# Patient Record
Sex: Female | Born: 1935 | Race: White | Hispanic: No | Smoking: Never smoker
Health system: Southern US, Community
[De-identification: ages and names within clinical notes are randomized; demographics above are authoritative.]

## PROBLEM LIST (undated history)

## (undated) DIAGNOSIS — K5520 Angiodysplasia of colon without hemorrhage: Secondary | ICD-10-CM

## (undated) DIAGNOSIS — I471 Supraventricular tachycardia, unspecified: Secondary | ICD-10-CM

## (undated) DIAGNOSIS — K5790 Diverticulosis of intestine, part unspecified, without perforation or abscess without bleeding: Secondary | ICD-10-CM

## (undated) DIAGNOSIS — I10 Essential (primary) hypertension: Secondary | ICD-10-CM

## (undated) DIAGNOSIS — D510 Vitamin B12 deficiency anemia due to intrinsic factor deficiency: Secondary | ICD-10-CM

## (undated) DIAGNOSIS — H905 Unspecified sensorineural hearing loss: Secondary | ICD-10-CM

## (undated) DIAGNOSIS — G450 Vertebro-basilar artery syndrome: Secondary | ICD-10-CM

## (undated) DIAGNOSIS — R413 Other amnesia: Secondary | ICD-10-CM

## (undated) DIAGNOSIS — I951 Orthostatic hypotension: Secondary | ICD-10-CM

## (undated) DIAGNOSIS — R42 Dizziness and giddiness: Secondary | ICD-10-CM

## (undated) DIAGNOSIS — E785 Hyperlipidemia, unspecified: Secondary | ICD-10-CM

## (undated) HISTORY — DX: Hyperlipidemia, unspecified: E78.5

## (undated) HISTORY — DX: Diverticulosis of intestine, part unspecified, without perforation or abscess without bleeding: K57.90

## (undated) HISTORY — DX: Vertebro-basilar artery syndrome: G45.0

## (undated) HISTORY — PX: APPENDECTOMY: SHX54

## (undated) HISTORY — PX: OTHER SURGICAL HISTORY: SHX169

## (undated) HISTORY — PX: BREAST BIOPSY: SHX20

## (undated) HISTORY — DX: Unspecified sensorineural hearing loss: H90.5

## (undated) HISTORY — DX: Essential (primary) hypertension: I10

## (undated) HISTORY — PX: SKIN GRAFT: SHX250

## (undated) HISTORY — DX: Other amnesia: R41.3

## (undated) HISTORY — DX: Supraventricular tachycardia, unspecified: I47.10

## (undated) HISTORY — DX: Angiodysplasia of colon without hemorrhage: K55.20

## (undated) HISTORY — DX: Orthostatic hypotension: I95.1

## (undated) HISTORY — DX: Vitamin B12 deficiency anemia due to intrinsic factor deficiency: D51.0

## (undated) HISTORY — DX: Dizziness and giddiness: R42

## (undated) HISTORY — DX: Supraventricular tachycardia: I47.1

## (undated) HISTORY — PX: THYROID SURGERY: SHX805

---

## 1996-11-27 HISTORY — PX: CATARACT EXTRACTION, BILATERAL: SHX1313

## 1997-08-29 DIAGNOSIS — G450 Vertebro-basilar artery syndrome: Secondary | ICD-10-CM

## 1997-08-29 HISTORY — DX: Vertebro-basilar artery syndrome: G45.0

## 1999-08-30 DIAGNOSIS — H905 Unspecified sensorineural hearing loss: Secondary | ICD-10-CM

## 1999-08-30 HISTORY — DX: Unspecified sensorineural hearing loss: H90.5

## 2007-08-30 DIAGNOSIS — K5790 Diverticulosis of intestine, part unspecified, without perforation or abscess without bleeding: Secondary | ICD-10-CM

## 2007-08-30 HISTORY — DX: Diverticulosis of intestine, part unspecified, without perforation or abscess without bleeding: K57.90

## 2011-07-12 ENCOUNTER — Encounter: Payer: Self-pay | Admitting: Internal Medicine

## 2011-07-27 ENCOUNTER — Encounter: Payer: Self-pay | Admitting: *Deleted

## 2011-07-28 ENCOUNTER — Other Ambulatory Visit: Payer: Self-pay

## 2011-07-28 ENCOUNTER — Ambulatory Visit (INDEPENDENT_AMBULATORY_CARE_PROVIDER_SITE_OTHER): Payer: Medicare Other | Admitting: Internal Medicine

## 2011-07-28 ENCOUNTER — Encounter: Payer: Self-pay | Admitting: Internal Medicine

## 2011-07-28 DIAGNOSIS — I471 Supraventricular tachycardia: Secondary | ICD-10-CM

## 2011-07-28 DIAGNOSIS — R079 Chest pain, unspecified: Secondary | ICD-10-CM | POA: Insufficient documentation

## 2011-07-28 DIAGNOSIS — E785 Hyperlipidemia, unspecified: Secondary | ICD-10-CM | POA: Insufficient documentation

## 2011-07-28 DIAGNOSIS — I498 Other specified cardiac arrhythmias: Secondary | ICD-10-CM

## 2011-07-28 MED ORDER — NITROGLYCERIN 0.4 MG SL SUBL: 0.4000 mg | SUBLINGUAL_TABLET | SUBLINGUAL | Status: DC | PRN

## 2011-07-28 NOTE — Telephone Encounter (Signed)
Patient called,was told Dr. [name] wanted her to have a prescription for NTG 0.4mg  SL prn.Stated she wanted prescription called to [facility] [address] Tia Alert.[facility] [address] [facility] and order given to pharmacist NTG 0.4 mg Sl prn # 30 12 refills.

## 2011-07-28 NOTE — Assessment & Plan Note (Signed)
Continue verapamil for now Consider beta blocker as above pending results of stress testing  She is clear that she would not want EP study.  Given her age, I think that this is quite reasonable. Echo is pending.

## 2011-07-28 NOTE — Assessment & Plan Note (Signed)
She has not previously tolerated statins Will consider a different statin if CAD is identified (As above)

## 2011-07-28 NOTE — Patient Instructions (Signed)
Your physician recommends that you schedule a follow-up appointment in: 4 weeks with Dr [name]   Your physician has requested that you have an echocardiogram. Echocardiography is a painless test that uses sound waves to create images of your heart. It provides your doctor with information about the size and shape of your heart and how well your heart's chambers and valves are working. This procedure takes approximately one hour. There are no restrictions for this procedure.   Your physician has requested that you have a lexiscan myoview. For further information please visit [email or web address]. Please follow instruction sheet, as given.

## 2011-07-28 NOTE — Assessment & Plan Note (Signed)
The patient presents today for cardiology evaluation of recent SVT and also chest discomfort. She has episodes of exertional arm/chest pain which have both typical and atypical features.  She has multiple CAD risk factors including age, HL, and diabetes.  Today, we discussed options of stress testing vs cath.  She is clear that she would prefer a conservative/ noninvasive approach.  We will therefore order Lexiscan Myoview and an echo.  If these are low risk, then we will plan medical therapy going forward.  If high risk features,then we will consider cath.  Given her advanced age, I agree with her that we should try medications rather than cath if possible. I have given slNTG prescription and directions for usage today.  She will call 911 for chest pain not relieved with NTG. Given h/o GI bleeding, she wishes to hold off on ASA until results of stress testing are available.  If normal, I will likely not start ASA.  If not completely normal, we will consider ASA 81mg  daily. If stress test is abnormal, I will switch verapamil to a beta blocker. She has not tolerated statins before due to myalgias.  As our focus is on quality of life, I will not start a statin unless CAD is identified.
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
## 2011-08-03 ENCOUNTER — Ambulatory Visit (HOSPITAL_COMMUNITY): Payer: Medicare Other | Attending: Cardiovascular Disease | Admitting: Radiology

## 2011-08-03 ENCOUNTER — Ambulatory Visit (HOSPITAL_BASED_OUTPATIENT_CLINIC_OR_DEPARTMENT_OTHER): Payer: Medicare Other | Admitting: Radiology

## 2011-08-03 DIAGNOSIS — E785 Hyperlipidemia, unspecified: Secondary | ICD-10-CM | POA: Insufficient documentation

## 2011-08-03 DIAGNOSIS — R002 Palpitations: Secondary | ICD-10-CM | POA: Insufficient documentation

## 2011-08-03 DIAGNOSIS — R5383 Other fatigue: Secondary | ICD-10-CM | POA: Insufficient documentation

## 2011-08-03 DIAGNOSIS — I471 Supraventricular tachycardia: Secondary | ICD-10-CM

## 2011-08-03 DIAGNOSIS — I10 Essential (primary) hypertension: Secondary | ICD-10-CM | POA: Insufficient documentation

## 2011-08-03 DIAGNOSIS — R42 Dizziness and giddiness: Secondary | ICD-10-CM | POA: Insufficient documentation

## 2011-08-03 DIAGNOSIS — R0602 Shortness of breath: Secondary | ICD-10-CM

## 2011-08-03 DIAGNOSIS — E119 Type 2 diabetes mellitus without complications: Secondary | ICD-10-CM | POA: Insufficient documentation

## 2011-08-03 DIAGNOSIS — R5381 Other malaise: Secondary | ICD-10-CM | POA: Insufficient documentation

## 2011-08-03 DIAGNOSIS — Z8249 Family history of ischemic heart disease and other diseases of the circulatory system: Secondary | ICD-10-CM | POA: Insufficient documentation

## 2011-08-03 DIAGNOSIS — I498 Other specified cardiac arrhythmias: Secondary | ICD-10-CM | POA: Insufficient documentation

## 2011-08-03 DIAGNOSIS — R079 Chest pain, unspecified: Secondary | ICD-10-CM | POA: Insufficient documentation

## 2011-08-03 DIAGNOSIS — M79609 Pain in unspecified limb: Secondary | ICD-10-CM | POA: Insufficient documentation

## 2011-08-03 MED ORDER — TECHNETIUM TC 99M TETROFOSMIN IV KIT
11.0000 | PACK | Freq: Once | INTRAVENOUS | Status: AC | PRN
  Administered 2011-08-03: 11 via INTRAVENOUS

## 2011-08-03 MED ORDER — REGADENOSON 0.4 MG/5ML IV SOLN
0.4000 mg | Freq: Once | INTRAVENOUS | Status: AC
  Administered 2011-08-03: 0.4 mg via INTRAVENOUS

## 2011-08-03 MED ORDER — TECHNETIUM TC 99M TETROFOSMIN IV KIT
33.0000 | PACK | Freq: Once | INTRAVENOUS | Status: AC | PRN
  Administered 2011-08-03: 33 via INTRAVENOUS

## 2011-08-03 NOTE — Progress Notes (Signed)
[place] [address] [phone]  Cardiology Nuclear Med Laura Swanson is a 77 y.o. female [id number] 12-May-1925   Nuclear Med Background Indication for Stress Test:  Evaluation for Ischemia History:  10/12 yrs ago ? GXT per patient (No report available), 06/30/11 Event monitor: SVT> verapamil started Cardiac Risk Factors: Family History - CAD, Hypertension, Lipids and NIDDM  Symptoms:  Chest Pressure and Chest Tightness with and without Exertion associated with (L) arm pain (last date of chest discomfort yesterday), Dizziness, DOE, Fatigue, Fatigue with Exertion, Palpitations and SOB   Nuclear Pre-Procedure Caffeine/Decaff Intake:  None NPO After: 7:30pm   Lungs:  Clear IV 0.9% NS with Angio Cath:  20 G  IV Site: R Forearm x 1, tolerated well  IV Started by:  Irven Baltimore, RN  Chest Size (in):  38 Cup Size: B  Height: 5\' 2"  (1.575 m)  Weight:  138 lb (62.596 kg)  BMI:  Body mass index is 25.24 kg/(m^2). Tech Comments:  Took Metformin this AM    Nuclear Med Study 1 or 2 day study: 1 day  Stress Test Type:  Lexiscan  Reading MD: Mertie Moores, MD  Order Authorizing Provider:  Thompson Grayer, MD  Resting Radionuclide: Technetium 35m Tetrofosmin  Resting Radionuclide Dose: 11.0 mCi   Stress Radionuclide:  Technetium 43m Tetrofosmin  Stress Radionuclide Dose: 33.0 mCi           Stress Protocol Rest HR: 82 Stress HR: 106  Rest BP: 126/77 Stress BP: 129/68  Exercise Time (min): n/a METS: n/a   Predicted Max HR: 136 bpm % Max HR: 77.94 bpm Rate Pressure Product: 13674   Dose of Adenosine (mg):  n/a Dose of Lexiscan: 0.4 mg  Dose of Atropine (mg): n/a Dose of Dobutamine: n/a mcg/kg/min (at max HR)  Stress Test Technologist: Irven Baltimore, RN  Nuclear Technologist:  Charlton Amor, CNMT     Rest Procedure:  Myocardial perfusion imaging was performed at rest 45 minutes following the intravenous  administration of Technetium 51m Tetrofosmin. Rest ECG: NSR with poor R wave progression, LAFB, ? old anteroseptal MI, ? ILBBB  Stress Procedure:  The patient received IV Lexiscan 0.4 mg over 15-seconds.  Technetium 84m Tetrofosmin injected at 30-seconds.  The EKG was non diagnostic due to baseline changes. There was a rare PVC.  Quantitative spect images were obtained after a 45 minute delay. Stress ECG: No significant change from baseline ECG  QPS Raw Data Images:  Normal; no motion artifact; normal heart/lung ratio. Stress Images:  Normal homogeneous uptake in all areas of the myocardium. Rest Images:  Normal homogeneous uptake in all areas of the myocardium. Subtraction (SDS):  No evidence of ischemia. Transient Ischemic Dilatation (Normal <1.22):  0.88 Lung/Heart Ratio (Normal <0.45):  0.31  Quantitative Gated Spect Images QGS EDV:  43 ml QGS ESV:  10 ml QGS cine images:  NL LV Function; NL Wall Motion QGS EF: 78%  Impression Exercise Capacity:  Lexiscan with no exercise. BP Response:  Normal blood pressure response. Clinical Symptoms:  No chest pain. ECG Impression:  No significant ST segment change suggestive of ischemia. Comparison with Prior Nuclear Study: No previous nuclear study performed  Overall Impression:  Normal stress nuclear study.  Normal LV function with an EF of 78%.  No evidence of ischemia.   Thayer Headings, [name]., MD, [facility] 08/03/2011, 6:30 PM

## 2011-08-29 ENCOUNTER — Encounter: Payer: Self-pay | Admitting: Internal Medicine

## 2011-08-29 ENCOUNTER — Ambulatory Visit (INDEPENDENT_AMBULATORY_CARE_PROVIDER_SITE_OTHER): Payer: Medicare Other | Admitting: Internal Medicine

## 2011-08-29 DIAGNOSIS — I498 Other specified cardiac arrhythmias: Secondary | ICD-10-CM

## 2011-08-29 DIAGNOSIS — I471 Supraventricular tachycardia: Secondary | ICD-10-CM

## 2011-08-29 DIAGNOSIS — R079 Chest pain, unspecified: Secondary | ICD-10-CM

## 2011-08-29 NOTE — Patient Instructions (Signed)
Your physician wants you to follow-up in: 4 months with Dr Allred You will receive a reminder letter in the mail two months in advance. If you don't receive a letter, please call our office to schedule the follow-up appointment.  

## 2011-08-30 ENCOUNTER — Encounter: Payer: Self-pay | Admitting: Internal Medicine

## 2011-08-30 NOTE — Assessment & Plan Note (Signed)
Continue medical therapy long term No changes at this time
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
## 2011-08-30 NOTE — Assessment & Plan Note (Signed)
Recent myoview is normal No further CV workup is planned.  Interestingly, she attributes her symptoms of dizziness, palpitations, and chest pain to forteo.  Upon review of the literature, it appears that the incidence of orthostatic hypotension is 5%, dizziness 8%, and chest pain  3% with forteo. Though I cannot be sure that forteo has anything to do with her symptoms, I am happy that she is clinically doing better at this time.  She should continue forteo under the direction of Dr Nicki Reaper. As her myoview was normal, no further CV testing is planned at this time.

## 2011-09-29 DIAGNOSIS — D539 Nutritional anemia, unspecified: Secondary | ICD-10-CM | POA: Diagnosis not present

## 2011-10-06 DIAGNOSIS — M810 Age-related osteoporosis without current pathological fracture: Secondary | ICD-10-CM | POA: Diagnosis not present

## 2011-10-10 DIAGNOSIS — E119 Type 2 diabetes mellitus without complications: Secondary | ICD-10-CM | POA: Diagnosis not present

## 2011-10-10 DIAGNOSIS — E782 Mixed hyperlipidemia: Secondary | ICD-10-CM | POA: Diagnosis not present

## 2011-10-10 DIAGNOSIS — Z79899 Other long term (current) drug therapy: Secondary | ICD-10-CM | POA: Diagnosis not present

## 2011-10-13 DIAGNOSIS — E119 Type 2 diabetes mellitus without complications: Secondary | ICD-10-CM | POA: Diagnosis not present

## 2011-10-13 DIAGNOSIS — I471 Supraventricular tachycardia: Secondary | ICD-10-CM | POA: Diagnosis not present

## 2011-10-13 DIAGNOSIS — R079 Chest pain, unspecified: Secondary | ICD-10-CM | POA: Diagnosis not present

## 2011-10-24 DIAGNOSIS — D510 Vitamin B12 deficiency anemia due to intrinsic factor deficiency: Secondary | ICD-10-CM | POA: Diagnosis not present

## 2011-11-22 DIAGNOSIS — D510 Vitamin B12 deficiency anemia due to intrinsic factor deficiency: Secondary | ICD-10-CM | POA: Diagnosis not present

## 2011-12-28 ENCOUNTER — Encounter: Payer: Self-pay | Admitting: Internal Medicine

## 2011-12-28 ENCOUNTER — Ambulatory Visit (INDEPENDENT_AMBULATORY_CARE_PROVIDER_SITE_OTHER): Payer: Medicare Other | Admitting: Internal Medicine

## 2011-12-28 DIAGNOSIS — I471 Supraventricular tachycardia: Secondary | ICD-10-CM

## 2011-12-28 DIAGNOSIS — E785 Hyperlipidemia, unspecified: Secondary | ICD-10-CM | POA: Diagnosis not present

## 2011-12-28 DIAGNOSIS — I498 Other specified cardiac arrhythmias: Secondary | ICD-10-CM | POA: Diagnosis not present

## 2011-12-28 DIAGNOSIS — R079 Chest pain, unspecified: Secondary | ICD-10-CM

## 2011-12-28 DIAGNOSIS — I4891 Unspecified atrial fibrillation: Secondary | ICD-10-CM | POA: Diagnosis not present

## 2011-12-28 DIAGNOSIS — I10 Essential (primary) hypertension: Secondary | ICD-10-CM | POA: Diagnosis not present

## 2011-12-28 NOTE — Patient Instructions (Signed)
Your physician wants you to follow-up in: 4 months with Kathrene Alu and 12 months with Dr Vallery Ridge will receive a reminder letter in the mail two months in advance. If you don't receive a letter, please call our office to schedule the follow-up appointment.

## 2011-12-28 NOTE — Assessment & Plan Note (Signed)
Continue medical therapy long term No changes at this time

## 2011-12-28 NOTE — Assessment & Plan Note (Signed)
She has not previously tolerated statins She recently tried crestor but could not tolerate this.

## 2011-12-28 NOTE — Assessment & Plan Note (Signed)
Resolved She reports musculoskeletal pain in February for which she was evaluated by Dr Nicki Reaper. Presently, she is doing very well.  No further workup planned.  She has not tolerated crestor due to leg cramps.
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
## 2011-12-30 DIAGNOSIS — Z79899 Other long term (current) drug therapy: Secondary | ICD-10-CM | POA: Diagnosis not present

## 2011-12-30 DIAGNOSIS — E559 Vitamin D deficiency, unspecified: Secondary | ICD-10-CM | POA: Diagnosis not present

## 2012-01-04 NOTE — Progress Notes (Signed)
Addended by: Concepcion Living D on: 01/04/2012 10:34 AM   Modules accepted: Orders

## 2012-01-12 DIAGNOSIS — H40019 Open angle with borderline findings, low risk, unspecified eye: Secondary | ICD-10-CM | POA: Diagnosis not present

## 2012-01-26 DIAGNOSIS — D510 Vitamin B12 deficiency anemia due to intrinsic factor deficiency: Secondary | ICD-10-CM | POA: Diagnosis not present

## 2012-02-23 DIAGNOSIS — D510 Vitamin B12 deficiency anemia due to intrinsic factor deficiency: Secondary | ICD-10-CM | POA: Diagnosis not present

## 2012-03-20 DIAGNOSIS — D510 Vitamin B12 deficiency anemia due to intrinsic factor deficiency: Secondary | ICD-10-CM | POA: Diagnosis not present

## 2012-03-28 DIAGNOSIS — E782 Mixed hyperlipidemia: Secondary | ICD-10-CM | POA: Diagnosis not present

## 2012-03-28 DIAGNOSIS — E119 Type 2 diabetes mellitus without complications: Secondary | ICD-10-CM | POA: Diagnosis not present

## 2012-03-28 DIAGNOSIS — D510 Vitamin B12 deficiency anemia due to intrinsic factor deficiency: Secondary | ICD-10-CM | POA: Diagnosis not present

## 2012-04-05 DIAGNOSIS — M810 Age-related osteoporosis without current pathological fracture: Secondary | ICD-10-CM | POA: Diagnosis not present

## 2012-04-05 DIAGNOSIS — I4891 Unspecified atrial fibrillation: Secondary | ICD-10-CM | POA: Diagnosis not present

## 2012-04-05 DIAGNOSIS — E782 Mixed hyperlipidemia: Secondary | ICD-10-CM | POA: Diagnosis not present

## 2012-04-05 DIAGNOSIS — E119 Type 2 diabetes mellitus without complications: Secondary | ICD-10-CM | POA: Diagnosis not present

## 2012-05-03 ENCOUNTER — Encounter: Payer: Self-pay | Admitting: Nurse Practitioner

## 2012-05-03 ENCOUNTER — Ambulatory Visit (INDEPENDENT_AMBULATORY_CARE_PROVIDER_SITE_OTHER): Payer: Medicare Other | Admitting: Nurse Practitioner

## 2012-05-03 VITALS — BP 128/70 | HR 80 | Ht 64.0 in | Wt 142.0 lb

## 2012-05-03 DIAGNOSIS — I498 Other specified cardiac arrhythmias: Secondary | ICD-10-CM | POA: Diagnosis not present

## 2012-05-03 DIAGNOSIS — I471 Supraventricular tachycardia: Secondary | ICD-10-CM

## 2012-05-03 DIAGNOSIS — R079 Chest pain, unspecified: Secondary | ICD-10-CM | POA: Diagnosis not present

## 2012-05-03 MED ORDER — METOPROLOL TARTRATE 25 MG PO TABS: 25.0000 mg | ORAL_TABLET | Freq: Two times a day (BID) | ORAL | Status: DC

## 2012-05-03 NOTE — Patient Instructions (Addendum)
Continue with your current medicines except stop the Losartan.  We are going to add Lopressor 25 mg two times a day. This is to help keep your heart rate from going too fast  I want to see you in 2 months.   Call the [facility] office at [phone] if you have any questions, problems or concerns.
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
## 2012-05-08 DIAGNOSIS — D510 Vitamin B12 deficiency anemia due to intrinsic factor deficiency: Secondary | ICD-10-CM | POA: Diagnosis not present

## 2012-05-17 DIAGNOSIS — Z23 Encounter for immunization: Secondary | ICD-10-CM | POA: Diagnosis not present

## 2012-05-23 DIAGNOSIS — L570 Actinic keratosis: Secondary | ICD-10-CM | POA: Diagnosis not present

## 2012-06-07 DIAGNOSIS — D510 Vitamin B12 deficiency anemia due to intrinsic factor deficiency: Secondary | ICD-10-CM | POA: Diagnosis not present

## 2012-06-25 DIAGNOSIS — H6120 Impacted cerumen, unspecified ear: Secondary | ICD-10-CM | POA: Diagnosis not present

## 2012-06-25 DIAGNOSIS — H9190 Unspecified hearing loss, unspecified ear: Secondary | ICD-10-CM | POA: Diagnosis not present

## 2012-06-26 DIAGNOSIS — Z79899 Other long term (current) drug therapy: Secondary | ICD-10-CM | POA: Diagnosis not present

## 2012-06-26 DIAGNOSIS — E782 Mixed hyperlipidemia: Secondary | ICD-10-CM | POA: Diagnosis not present

## 2012-07-03 ENCOUNTER — Ambulatory Visit: Payer: Medicare Other | Admitting: Nurse Practitioner

## 2012-07-05 DIAGNOSIS — M810 Age-related osteoporosis without current pathological fracture: Secondary | ICD-10-CM | POA: Diagnosis not present

## 2012-07-05 DIAGNOSIS — R002 Palpitations: Secondary | ICD-10-CM | POA: Diagnosis not present

## 2012-07-12 ENCOUNTER — Ambulatory Visit: Payer: Medicare Other | Admitting: Nurse Practitioner

## 2012-07-17 DIAGNOSIS — M810 Age-related osteoporosis without current pathological fracture: Secondary | ICD-10-CM | POA: Diagnosis not present

## 2012-07-19 ENCOUNTER — Ambulatory Visit: Payer: Medicare Other | Admitting: Internal Medicine

## 2012-07-23 ENCOUNTER — Ambulatory Visit (INDEPENDENT_AMBULATORY_CARE_PROVIDER_SITE_OTHER): Payer: Medicare Other | Admitting: Internal Medicine

## 2012-07-23 VITALS — BP 144/78 | HR 68 | Ht 64.0 in | Wt 143.0 lb

## 2012-07-23 DIAGNOSIS — I10 Essential (primary) hypertension: Secondary | ICD-10-CM | POA: Insufficient documentation

## 2012-07-23 DIAGNOSIS — I498 Other specified cardiac arrhythmias: Secondary | ICD-10-CM

## 2012-07-23 DIAGNOSIS — I471 Supraventricular tachycardia: Secondary | ICD-10-CM

## 2012-07-23 DIAGNOSIS — J0100 Acute maxillary sinusitis, unspecified: Secondary | ICD-10-CM | POA: Diagnosis not present
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
## 2012-07-23 NOTE — Assessment & Plan Note (Signed)
BP is mildly elevated 2 gram sodium restriction is advised Given advanced age, I am reluctant to make aggressive changes today She will journal her BP readings at home and present them upon return  Return to see Truitt Merle in 6 months I will see again in 1year

## 2012-07-23 NOTE — Patient Instructions (Signed)
Your physician wants you to follow-up in: 6 months with Laura Swanson and 12 months with Dr Vallery Ridge will receive a reminder letter in the mail two months in advance. If you don't receive a letter, please call our office to schedule the follow-up appointment.

## 2012-07-23 NOTE — Assessment & Plan Note (Signed)
Stable No change required today  

## 2012-08-08 DIAGNOSIS — H40019 Open angle with borderline findings, low risk, unspecified eye: Secondary | ICD-10-CM | POA: Diagnosis not present

## 2012-08-09 DIAGNOSIS — Z1231 Encounter for screening mammogram for malignant neoplasm of breast: Secondary | ICD-10-CM | POA: Diagnosis not present

## 2012-08-09 DIAGNOSIS — Z803 Family history of malignant neoplasm of breast: Secondary | ICD-10-CM | POA: Diagnosis not present

## 2012-08-13 DIAGNOSIS — N3000 Acute cystitis without hematuria: Secondary | ICD-10-CM | POA: Diagnosis not present

## 2012-08-17 DIAGNOSIS — D510 Vitamin B12 deficiency anemia due to intrinsic factor deficiency: Secondary | ICD-10-CM | POA: Diagnosis not present

## 2012-09-13 DIAGNOSIS — D510 Vitamin B12 deficiency anemia due to intrinsic factor deficiency: Secondary | ICD-10-CM | POA: Diagnosis not present

## 2012-09-18 ENCOUNTER — Telehealth: Payer: Self-pay | Admitting: Internal Medicine

## 2012-09-18 ENCOUNTER — Ambulatory Visit (INDEPENDENT_AMBULATORY_CARE_PROVIDER_SITE_OTHER): Payer: Medicare Other | Admitting: Nurse Practitioner

## 2012-09-18 ENCOUNTER — Encounter: Payer: Self-pay | Admitting: Nurse Practitioner

## 2012-09-18 VITALS — BP 130/70 | HR 76 | Ht 63.0 in | Wt 143.8 lb

## 2012-09-18 DIAGNOSIS — I10 Essential (primary) hypertension: Secondary | ICD-10-CM

## 2012-09-18 DIAGNOSIS — R002 Palpitations: Secondary | ICD-10-CM

## 2012-09-18 LAB — CBC WITH DIFFERENTIAL/PLATELET
Basophils Absolute: 0 10*3/uL (ref 0.0–0.1)
Basophils Relative: 0.3 % (ref 0.0–3.0)
Eosinophils Absolute: 0.3 10*3/uL (ref 0.0–0.7)
Eosinophils Relative: 3.2 % (ref 0.0–5.0)
HCT: 37.3 % (ref 36.0–46.0)
Hemoglobin: 12.7 g/dL (ref 12.0–15.0)
Lymphocytes Relative: 34.5 % (ref 12.0–46.0)
Lymphs Abs: 2.7 10*3/uL (ref 0.7–4.0)
MCHC: 34 g/dL (ref 30.0–36.0)
MCV: 90.9 fl (ref 78.0–100.0)
Monocytes Absolute: 0.7 10*3/uL (ref 0.1–1.0)
Monocytes Relative: 8.9 % (ref 3.0–12.0)
Neutro Abs: 4.2 10*3/uL (ref 1.4–7.7)
Neutrophils Relative %: 53.1 % (ref 43.0–77.0)
Platelets: 301 10*3/uL (ref 150.0–400.0)
RBC: 4.1 Mil/uL (ref 3.87–5.11)
RDW: 13.7 % (ref 11.5–14.6)
WBC: 7.9 10*3/uL (ref 4.5–10.5)

## 2012-09-18 LAB — BASIC METABOLIC PANEL
BUN: 22 mg/dL (ref 6–23)
CO2: 28 mEq/L (ref 19–32)
Calcium: 9.2 mg/dL (ref 8.4–10.5)
Chloride: 98 mEq/L (ref 96–112)
Creatinine, Ser: 1 mg/dL (ref 0.4–1.2)
GFR: 57.97 mL/min — ABNORMAL LOW (ref >60.00)
Glucose, Bld: 161 mg/dL — ABNORMAL HIGH (ref 70–99)
Potassium: 4.3 mEq/L (ref 3.5–5.1)
Sodium: 133 mEq/L — ABNORMAL LOW (ref 135–145)

## 2012-09-18 MED ORDER — NITROGLYCERIN 0.4 MG SL SUBL: 0.4000 mg | SUBLINGUAL_TABLET | SUBLINGUAL | Status: DC | PRN
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
## 2012-09-18 NOTE — Telephone Encounter (Signed)
Pt's dtr calling re pt having palpitations since Sunday, none now,  has had left arm pain for a few days, BP last night 180/99 pulse 95 6p, 111/64 pulse 75 8p, this am 123/70 pulse 57 730am, pls advise [phone] dtr karen stone

## 2012-09-18 NOTE — Patient Instructions (Addendum)
Stay on your current medicines  I will see you in 3 months  Try to minimize your use of salt  We are going to check labs today  Call the [place] office at [phone] if you have any questions, problems or concerns.

## 2012-09-18 NOTE — Telephone Encounter (Signed)
Discussed with Cecille Rubin and called daughter back.  Offered appointment tomorrow but they are worried about weather and so they are going to come today at 1:30 and bring her BP cuff with them to calibrate.

## 2012-09-20 ENCOUNTER — Telehealth: Payer: Self-pay | Admitting: Internal Medicine

## 2012-09-20 NOTE — Telephone Encounter (Signed)
Message copied by Earvin Hansen on Thu Sep 20, 2012  2:30 PM ------      Message from: Burtis Junes      Created: Tue Sep 18, 2012  4:20 PM       Ok to report. Labs are satisfactory. Blood sugar is up some.

## 2012-09-20 NOTE — Telephone Encounter (Signed)
Pt calling to get lab results.

## 2012-09-20 NOTE — Telephone Encounter (Signed)
Advised patient of lab results, last night blood sugar was 116

## 2012-10-15 DIAGNOSIS — E119 Type 2 diabetes mellitus without complications: Secondary | ICD-10-CM | POA: Diagnosis not present

## 2012-10-15 DIAGNOSIS — D510 Vitamin B12 deficiency anemia due to intrinsic factor deficiency: Secondary | ICD-10-CM | POA: Diagnosis not present

## 2012-10-15 DIAGNOSIS — Z79899 Other long term (current) drug therapy: Secondary | ICD-10-CM | POA: Diagnosis not present

## 2012-10-15 DIAGNOSIS — I4891 Unspecified atrial fibrillation: Secondary | ICD-10-CM | POA: Diagnosis not present

## 2012-10-15 DIAGNOSIS — I10 Essential (primary) hypertension: Secondary | ICD-10-CM | POA: Diagnosis not present

## 2012-11-29 DIAGNOSIS — D510 Vitamin B12 deficiency anemia due to intrinsic factor deficiency: Secondary | ICD-10-CM | POA: Diagnosis not present

## 2012-12-18 ENCOUNTER — Ambulatory Visit: Payer: Medicare Other | Admitting: Nurse Practitioner

## 2012-12-25 ENCOUNTER — Ambulatory Visit (INDEPENDENT_AMBULATORY_CARE_PROVIDER_SITE_OTHER): Payer: Medicare Other | Admitting: Nurse Practitioner

## 2012-12-25 ENCOUNTER — Encounter: Payer: Self-pay | Admitting: Nurse Practitioner

## 2012-12-25 VITALS — BP 130/78 | HR 60 | Ht 64.0 in | Wt 141.1 lb

## 2012-12-25 DIAGNOSIS — R002 Palpitations: Secondary | ICD-10-CM | POA: Diagnosis not present

## 2012-12-25 DIAGNOSIS — I10 Essential (primary) hypertension: Secondary | ICD-10-CM

## 2012-12-25 NOTE — Patient Instructions (Addendum)
See Dr. [name] in September as planned  Stay on your current medicines  Stay active   Stay safe!  Call the [facility] office at [phone] if you have any questions, problems or concerns.
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
## 2013-01-01 DIAGNOSIS — D510 Vitamin B12 deficiency anemia due to intrinsic factor deficiency: Secondary | ICD-10-CM | POA: Diagnosis not present

## 2013-01-08 DIAGNOSIS — E119 Type 2 diabetes mellitus without complications: Secondary | ICD-10-CM | POA: Diagnosis not present

## 2013-01-08 DIAGNOSIS — E669 Obesity, unspecified: Secondary | ICD-10-CM | POA: Diagnosis not present

## 2013-01-14 DIAGNOSIS — I951 Orthostatic hypotension: Secondary | ICD-10-CM | POA: Diagnosis not present

## 2013-01-14 DIAGNOSIS — Z1211 Encounter for screening for malignant neoplasm of colon: Secondary | ICD-10-CM | POA: Diagnosis not present

## 2013-01-14 DIAGNOSIS — E669 Obesity, unspecified: Secondary | ICD-10-CM | POA: Diagnosis not present

## 2013-01-14 DIAGNOSIS — E119 Type 2 diabetes mellitus without complications: Secondary | ICD-10-CM | POA: Diagnosis not present

## 2013-01-31 DIAGNOSIS — D510 Vitamin B12 deficiency anemia due to intrinsic factor deficiency: Secondary | ICD-10-CM | POA: Diagnosis not present

## 2013-02-08 DIAGNOSIS — J0100 Acute maxillary sinusitis, unspecified: Secondary | ICD-10-CM | POA: Diagnosis not present

## 2013-03-04 DIAGNOSIS — D510 Vitamin B12 deficiency anemia due to intrinsic factor deficiency: Secondary | ICD-10-CM | POA: Diagnosis not present

## 2013-04-05 DIAGNOSIS — D510 Vitamin B12 deficiency anemia due to intrinsic factor deficiency: Secondary | ICD-10-CM | POA: Diagnosis not present

## 2013-04-10 DIAGNOSIS — H40019 Open angle with borderline findings, low risk, unspecified eye: Secondary | ICD-10-CM | POA: Diagnosis not present

## 2013-04-13 DIAGNOSIS — S60229A Contusion of unspecified hand, initial encounter: Secondary | ICD-10-CM | POA: Diagnosis not present

## 2013-04-15 DIAGNOSIS — R279 Unspecified lack of coordination: Secondary | ICD-10-CM | POA: Diagnosis not present

## 2013-04-15 DIAGNOSIS — R42 Dizziness and giddiness: Secondary | ICD-10-CM | POA: Diagnosis not present

## 2013-04-16 DIAGNOSIS — E7800 Pure hypercholesterolemia, unspecified: Secondary | ICD-10-CM | POA: Diagnosis not present

## 2013-04-16 DIAGNOSIS — Z79899 Other long term (current) drug therapy: Secondary | ICD-10-CM | POA: Diagnosis not present

## 2013-04-16 DIAGNOSIS — E119 Type 2 diabetes mellitus without complications: Secondary | ICD-10-CM | POA: Diagnosis not present

## 2013-04-19 DIAGNOSIS — I951 Orthostatic hypotension: Secondary | ICD-10-CM | POA: Diagnosis not present

## 2013-04-19 DIAGNOSIS — E119 Type 2 diabetes mellitus without complications: Secondary | ICD-10-CM | POA: Diagnosis not present

## 2013-04-19 DIAGNOSIS — E669 Obesity, unspecified: Secondary | ICD-10-CM | POA: Diagnosis not present

## 2013-04-22 ENCOUNTER — Other Ambulatory Visit: Payer: Self-pay | Admitting: Nurse Practitioner

## 2013-05-06 ENCOUNTER — Ambulatory Visit (INDEPENDENT_AMBULATORY_CARE_PROVIDER_SITE_OTHER): Payer: Medicare Other | Admitting: Internal Medicine

## 2013-05-06 ENCOUNTER — Encounter: Payer: Self-pay | Admitting: Internal Medicine

## 2013-05-06 VITALS — BP 100/64 | HR 65 | Ht 64.0 in | Wt 142.0 lb

## 2013-05-06 DIAGNOSIS — I951 Orthostatic hypotension: Secondary | ICD-10-CM | POA: Insufficient documentation

## 2013-05-06 DIAGNOSIS — I498 Other specified cardiac arrhythmias: Secondary | ICD-10-CM

## 2013-05-06 DIAGNOSIS — I471 Supraventricular tachycardia: Secondary | ICD-10-CM
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
## 2013-05-06 NOTE — Patient Instructions (Addendum)
Your physician recommends that you schedule a follow-up appointment in: 6 weeks with Laura Swanson and 12 months with Dr [name]  Increase salt intake   Support hose---prescription given

## 2013-05-09 DIAGNOSIS — Z23 Encounter for immunization: Secondary | ICD-10-CM | POA: Diagnosis not present

## 2013-05-13 DIAGNOSIS — D510 Vitamin B12 deficiency anemia due to intrinsic factor deficiency: Secondary | ICD-10-CM | POA: Diagnosis not present

## 2013-06-11 DIAGNOSIS — R079 Chest pain, unspecified: Secondary | ICD-10-CM | POA: Diagnosis not present

## 2013-06-11 DIAGNOSIS — D510 Vitamin B12 deficiency anemia due to intrinsic factor deficiency: Secondary | ICD-10-CM | POA: Diagnosis not present

## 2013-06-11 DIAGNOSIS — I951 Orthostatic hypotension: Secondary | ICD-10-CM | POA: Diagnosis not present

## 2013-06-12 ENCOUNTER — Encounter: Payer: Self-pay | Admitting: Physician Assistant

## 2013-06-12 ENCOUNTER — Ambulatory Visit (INDEPENDENT_AMBULATORY_CARE_PROVIDER_SITE_OTHER): Payer: Medicare Other | Admitting: Physician Assistant

## 2013-06-12 VITALS — BP 100/68 | HR 60 | Ht 64.0 in | Wt 139.0 lb

## 2013-06-12 DIAGNOSIS — I498 Other specified cardiac arrhythmias: Secondary | ICD-10-CM | POA: Diagnosis not present

## 2013-06-12 DIAGNOSIS — E785 Hyperlipidemia, unspecified: Secondary | ICD-10-CM

## 2013-06-12 DIAGNOSIS — I471 Supraventricular tachycardia: Secondary | ICD-10-CM

## 2013-06-12 DIAGNOSIS — R079 Chest pain, unspecified: Secondary | ICD-10-CM

## 2013-06-12 DIAGNOSIS — I951 Orthostatic hypotension: Secondary | ICD-10-CM

## 2013-06-12 DIAGNOSIS — I10 Essential (primary) hypertension: Secondary | ICD-10-CM | POA: Diagnosis not present

## 2013-06-12 MED ORDER — FAMOTIDINE 20 MG PO TABS: 20.0000 mg | ORAL_TABLET | Freq: Two times a day (BID) | ORAL | Status: DC

## 2013-06-12 NOTE — Progress Notes (Signed)
[place], [address], [place]  [address] Phone: [phone] Fax:  [phone]  Date:  06/12/2013   ID:  Silas Michiels, DOB 02-18-1925, MRN [id number]  PCP:  Ann Held, MD  Cardiologist:  Dr. Thompson Grayer     History of Present Illness: Laura Swanson is a 79 y.o. female who returns for the evaluation of CP.  She has a history of HTN, DM 2, HL, SVT which is managed medically and chronic palpitations, prior GI bleed, diverticular disease, vertebrobasilar insufficiency, dementia. Lexiscan Myoview (12/12): EF 78%, normal study. Echocardiogram (12/12): Mild LVH, vigorous LV function, EF 65-70%, moderate TR, PASP 38, trivial effusion. Last seen by Dr. [name] 04/2013.  At that time, she has some postural dizziness. She was asked to liberalize salt and use support stockings. She was to follow up with Truitt Merle, NP next week.    She has been using support hose since last seen.  She thinks her dizziness may be somewhat better.  However, she still notes lightheadedness with standing and difficulty with her balance.  She has not fallen anymore.  Her daughter notes her BP drops 40 points from sitting to standing at times.  She had a couple episodes of CP over the past weekend.  These occurred at rest while lying down to go to sleep.  It did not wake her up.  She notes radiation to her L shoulder.  She took NTG with relief.  She remains fairly active at her ALF.  She denies exertional CP.  She denies significant dyspnea.  She is NYHA Class II-IIb.  She likes to sleep in her recliner.  No PND.  No syncope.  She does note pain in her chest with palpation.  She denies pleuritic CP.  She denies CP assoc with certain meals.  She notes some questionable symptoms of dyspepsia.  No vomiting, diarrhea, melena, hematochezia.   Labs (1/14):   K 4.3, creatinine 1.0, Hgb 12.7  Wt Readings from Last 3 Encounters:  05/06/13 142 lb (64.411 kg)  12/25/12 141 lb 1.9 oz (64.012 kg)  09/18/12 143 lb 12.8 oz  (65.227 kg)     Past Medical History  Diagnosis Date  . Paroxysmal supraventricular tachycardia   . Occult GI bleeding 2009    capsule endoscopy  . Diverticular disease 2009    hx  . Osteoporosis   . Gastric ulcer 1990's    with MALT tumor of  stomach  . Hearing disorder, sensorineural 2001    initially had marked vertigo withacute layrnthitis, which lead to hearing loss  . Diabetes mellitus 1999  . Vertebrobasilar insufficiency 1999    on the basis of abnormal transcranial doppler studies and suspicious symptoms  . Pernicious anemia   . Angiodysplasia of colon   . Memory change   . Vertigo     controlled with meclizine  . Hypertension   . Hyperlipidemia     quit statin due to leg cramps    Current Outpatient Prescriptions  Medication Sig Dispense Refill  . calcium carbonate (OS-CAL) 600 MG TABS Take 600 mg by mouth daily.        . Cholecalciferol (VITAMIN D) 2000 UNITS CAPS Take 2,000 Units by mouth as directed. Take 2 daily      . Coenzyme Q10 (CO Q 10) 100 MG CAPS Take by mouth. Taking 200mg  daily      . meclizine (ANTIVERT) 12.5 MG tablet Take 25 mg by mouth 3 (three) times daily as needed.      Marland Kitchen  metFORMIN (GLUCOPHAGE) 500 MG tablet Take 500 mg by mouth 2 (two) times daily with a meal.      . metoprolol tartrate (LOPRESSOR) 25 MG tablet       . nitroGLYCERIN (NITROSTAT) 0.4 MG SL tablet Place 1 tablet (0.4 mg total) under the tongue every 5 (five) minutes as needed for chest pain.  25 tablet  0  . ONGLYZA 5 MG TABS tablet Take 1 tablet by mouth daily.      . rosuvastatin (CRESTOR) 10 MG tablet Take 10 mg by mouth as directed. 1 tablet on Friday only       No current facility-administered medications for this visit.    Allergies:    Allergies  Allergen Reactions  . Antiseptic Products, Misc.   Marland Kitchen Disinfectant Products Misc   . Iodine   . Lipitor [Atorvastatin]     cramps  . Statins   . Verapamil     constipation    Social History:  The patient  reports that  she has never smoked. She has never used smokeless tobacco. She reports that she does not drink alcohol or use illicit drugs.   Family History:  The patient's family history includes Breast cancer (age of onset: 18) in her mother; Heart failure in her father; Lung disease in her father.   ROS:  Please see the history of present illness.      All other systems reviewed and negative.   PHYSICAL EXAM: VS:  BP 110/50  Pulse 60  Ht 5\' 4"  (1.626 m)  Wt 139 lb (63.05 kg)  BMI 23.85 kg/m2  Filed Vitals:   06/12/13 0928 06/12/13 0939 06/12/13 1013 06/12/13 1016  BP: 108/70 100/70 102/68 110/50  Pulse: 61 58 60 60  Height: 5\' 4"  (1.626 m)     Weight: 139 lb (63.05 kg)        Well nourished, well developed, in no acute distress HEENT: normal Neck: no JVD Cardiac:  normal S1, S2; RRR; no murmur Lungs:  clear to auscultation bilaterally, no wheezing, rhonchi or rales Abd: soft, nontender, no hepatomegaly Ext: no edema Skin: warm and dry Neuro:  CNs 2-12 intact, no focal abnormalities noted  EKG:  NSR, HR 61, LAD, LVH, repol abnormality, no change from prior tracing     ASSESSMENT AND PLAN:  1. Chest Pain:  Symptoms are largely atypical with some typical features.  Myoview in 2012 was normal.  ECG is unchanged. She had an ECG yesterday at her PCP office that was reportedly changed.  I do not have this ECG but will request it.  She notes pain with lying flat and with palpation.  At this point, I do not believe that she needs further ischemic testing. I will have the patient start Pepcid 20 BID to see if this helps her symptoms.  I reviewed this with Dr. Thompson Grayer today who agreed. 2. SVT:  No apparent recurrence.  3. Orthostasis:  No orthostatic BP drop in the office today.  Continue with current Rx.  4. Hypertension:  Controlled.  5. Hyperlipidemia:  Continue statin. 6. Disposition:  F/u with Dr. Thompson Grayer in 6 mos.   Signed, Richardson Dopp, PA-C  06/12/2013 8:31 AM    Addendum:   I received the ECGs from her PCPs office.  These have been reviewed.  There are no significant changes when compared to prior tracings. Signed, Richardson Dopp, PA-C   06/12/2013 9:50 PM

## 2013-06-12 NOTE — Patient Instructions (Signed)
START PEPCID 20 MG TWICE DAILY  MAKE SURE TO CONTINUE TO WEAR SUPPORT HOSE  MAKE SURE TO GET UP SLOWLY FROM A LAYING DOWN OR SITTING POSITION  YOU CAN ADD EXTRA SALT TO YOUR DIET  Your physician wants you to follow-up in: [place] DR. ALLRED. You will receive a reminder letter in the mail two months in advance. If you don't receive a letter, please call our office to schedule the follow-up appointment.

## 2013-06-17 ENCOUNTER — Ambulatory Visit: Payer: Medicare Other | Admitting: Nurse Practitioner

## 2013-06-21 DIAGNOSIS — J0100 Acute maxillary sinusitis, unspecified: Secondary | ICD-10-CM | POA: Diagnosis not present

## 2013-07-09 DIAGNOSIS — D510 Vitamin B12 deficiency anemia due to intrinsic factor deficiency: Secondary | ICD-10-CM | POA: Diagnosis not present

## 2013-07-15 ENCOUNTER — Other Ambulatory Visit: Payer: Self-pay | Admitting: Nurse Practitioner

## 2013-08-15 DIAGNOSIS — D510 Vitamin B12 deficiency anemia due to intrinsic factor deficiency: Secondary | ICD-10-CM | POA: Diagnosis not present

## 2013-09-08 DIAGNOSIS — R059 Cough, unspecified: Secondary | ICD-10-CM | POA: Diagnosis not present

## 2013-09-08 DIAGNOSIS — J0100 Acute maxillary sinusitis, unspecified: Secondary | ICD-10-CM | POA: Diagnosis not present

## 2013-09-17 DIAGNOSIS — D510 Vitamin B12 deficiency anemia due to intrinsic factor deficiency: Secondary | ICD-10-CM | POA: Diagnosis not present

## 2013-09-24 DIAGNOSIS — IMO0001 Reserved for inherently not codable concepts without codable children: Secondary | ICD-10-CM | POA: Diagnosis not present

## 2013-09-24 DIAGNOSIS — E782 Mixed hyperlipidemia: Secondary | ICD-10-CM | POA: Diagnosis not present

## 2013-09-24 DIAGNOSIS — D539 Nutritional anemia, unspecified: Secondary | ICD-10-CM | POA: Diagnosis not present

## 2013-09-30 DIAGNOSIS — IMO0001 Reserved for inherently not codable concepts without codable children: Secondary | ICD-10-CM | POA: Diagnosis not present

## 2013-10-22 ENCOUNTER — Ambulatory Visit: Payer: Self-pay | Admitting: Podiatrist

## 2013-10-28 DIAGNOSIS — D510 Vitamin B12 deficiency anemia due to intrinsic factor deficiency: Secondary | ICD-10-CM | POA: Diagnosis not present

## 2013-11-05 ENCOUNTER — Encounter: Payer: Self-pay | Admitting: Podiatrist

## 2013-11-05 ENCOUNTER — Ambulatory Visit (INDEPENDENT_AMBULATORY_CARE_PROVIDER_SITE_OTHER): Payer: Medicare Other | Admitting: Podiatrist

## 2013-11-05 VITALS — BP 116/70 | HR 67 | Resp 18

## 2013-11-05 DIAGNOSIS — B351 Tinea unguium: Secondary | ICD-10-CM | POA: Diagnosis not present

## 2013-11-05 DIAGNOSIS — M79609 Pain in unspecified limb: Secondary | ICD-10-CM | POA: Diagnosis not present

## 2013-11-05 DIAGNOSIS — E119 Type 2 diabetes mellitus without complications: Secondary | ICD-10-CM | POA: Diagnosis not present

## 2013-11-05 NOTE — Progress Notes (Deleted)
Nails 1-5 left, 1 right.  Rest are just long.  Dorsal exostosis top of left foot.  Circulation 2/4 sensation intact

## 2013-11-05 NOTE — Patient Instructions (Signed)

## 2013-11-05 NOTE — Progress Notes (Signed)
   Subjective:    Patient ID: Laura Swanson, female    DOB: 02-28-1925, 80 y.o.   MRN: [id number]  HPI I AM HERE TO GET MY TOENAILS TRIMMED     Review of Systems  HENT: Positive for hearing loss and sinus pressure.   Eyes: Positive for visual disturbance.  Cardiovascular: Positive for leg swelling.  Gastrointestinal: Positive for constipation.  Endocrine:       EXCESSIVE THIRST  Musculoskeletal: Positive for gait problem.  Neurological: Positive for weakness.  All other systems reviewed and are negative.       Objective:   Physical Exam GENERAL APPEARANCE: Alert, conversant. Appropriately groomed. No acute distress.  VASCULAR: Pedal pulses palpable at 1/4 DP and PT bilateral.  Capillary refill time is immediate to all digits,  Proximal to distal cooling it warm to warm.   NEUROLOGIC: sensation is intact epicritically and protectively to 5.07 monofilament at 5/5 sites bilateral.  Light touch is intact bilateral, vibratory sensation decreased bilateral, achilles tendon reflex is intact bilateral.  MUSCULOSKELETAL: acceptable muscle strength, tone and stability bilateral. DERMATOLOGIC: skin color, texture, and turger are within normal limits.  No preulcerative lesions are seen, no interdigital maceration noted.  No open lesions present.  Digital nails are elongated, friable , fragile and uncomfortable in shoes.      Assessment & Plan:  Diabetes, symptommatic toenails  Discussed etiology, pathology, conservative vs. Surgical therapies and at this time debridement of symptomatic toenails was recommended.  Onychoreduction of symptomatic toenails was performed without iatrogenic incident.  Patient was instructed on signs and symptoms of infection and was told to call immediately should any of these arise.

## 2013-11-28 DIAGNOSIS — D510 Vitamin B12 deficiency anemia due to intrinsic factor deficiency: Secondary | ICD-10-CM | POA: Diagnosis not present

## 2013-12-09 DIAGNOSIS — H40019 Open angle with borderline findings, low risk, unspecified eye: Secondary | ICD-10-CM | POA: Diagnosis not present

## 2013-12-12 ENCOUNTER — Telehealth: Payer: Self-pay | Admitting: Internal Medicine

## 2013-12-12 NOTE — Telephone Encounter (Signed)
Says someone told her Mom to decrease her Metoprolol to once daily.  She says this is all coming from her Mom.  I do not see anywhere that it has been decreased.  She feels fine and is scheduled to follow up in June with Dr [name].  She will continue as she is taking until follow up

## 2013-12-12 NOTE — Telephone Encounter (Signed)
New Message:   Pt's daughter, Mrs. Joaquim Lai, is requesting a call back with clarification on her mom's meds. She wants to know what exactly she is to take and when.. States she believes her mom may be taking her meds incorrectly. The pt is scheduled to see Dr. [name] in June.

## 2013-12-24 DIAGNOSIS — IMO0001 Reserved for inherently not codable concepts without codable children: Secondary | ICD-10-CM | POA: Diagnosis not present

## 2013-12-24 DIAGNOSIS — E782 Mixed hyperlipidemia: Secondary | ICD-10-CM | POA: Diagnosis not present

## 2013-12-30 DIAGNOSIS — IMO0001 Reserved for inherently not codable concepts without codable children: Secondary | ICD-10-CM | POA: Diagnosis not present

## 2013-12-30 DIAGNOSIS — Z0000 Encounter for general adult medical examination without abnormal findings: Secondary | ICD-10-CM | POA: Diagnosis not present

## 2014-01-14 ENCOUNTER — Encounter: Payer: Self-pay | Admitting: Podiatrist

## 2014-01-14 ENCOUNTER — Ambulatory Visit (INDEPENDENT_AMBULATORY_CARE_PROVIDER_SITE_OTHER): Payer: Medicare Other | Admitting: Podiatrist

## 2014-01-14 VITALS — BP 146/85 | HR 66 | Resp 18

## 2014-01-14 DIAGNOSIS — E119 Type 2 diabetes mellitus without complications: Secondary | ICD-10-CM

## 2014-01-14 DIAGNOSIS — B351 Tinea unguium: Secondary | ICD-10-CM

## 2014-01-14 DIAGNOSIS — M79609 Pain in unspecified limb: Secondary | ICD-10-CM | POA: Diagnosis not present

## 2014-01-14 NOTE — Progress Notes (Signed)
My toenails get a little red around the base of the nails and I am here to get my toenails trimmed up   GENERAL APPEARANCE: Alert, conversant. Appropriately groomed. No acute distress.  VASCULAR: Pedal pulses palpable at 1/4 DP and PT bilateral. Capillary refill time is immediate to all digits, Proximal to distal cooling it warm to warm.  NEUROLOGIC: sensation is intact epicritically and protectively to 5.07 monofilament at 5/5 sites bilateral. Light touch is intact bilateral, vibratory sensation decreased bilateral, achilles tendon reflex is intact bilateral.  MUSCULOSKELETAL: acceptable muscle strength, tone and stability bilateral.  DERMATOLOGIC: skin color, texture, and turger are within normal limits. No preulcerative lesions are seen, no interdigital maceration noted. No open lesions present. Digital nails are elongated, friable , fragile and uncomfortable in shoes.  Assessment & Plan:   Diabetes, symptommatic toenails  Discussed etiology, pathology, conservative vs. Surgical therapies and at this time debridement of symptomatic toenails was recommended. Onychoreduction of symptomatic toenails was performed without iatrogenic incident. Patient was instructed on signs and symptoms of infection and was told to call immediately should any of these arise.

## 2014-01-14 NOTE — Patient Instructions (Signed)
Diabetes and Foot Care Diabetes may cause you to have problems because of poor blood supply (circulation) to your feet and legs. This may cause the skin on your feet to become thinner, break easier, and heal more slowly. Your skin may become dry, and the skin may peel and crack. You may also have nerve damage in your legs and feet causing decreased feeling in them. You may not notice minor injuries to your feet that could lead to infections or more serious problems. Taking care of your feet is one of the most important things you can do for yourself.  HOME CARE INSTRUCTIONS  Wear shoes at all times, even in the house. Do not go barefoot. Bare feet are easily injured.  Check your feet daily for blisters, cuts, and redness. If you cannot see the bottom of your feet, use a mirror or ask someone for help.  Wash your feet with warm water (do not use hot water) and mild soap. Then pat your feet and the areas between your toes until they are completely dry. Do not soak your feet as this can dry your skin.  Apply a moisturizing lotion or petroleum jelly (that does not contain alcohol and is unscented) to the skin on your feet and to dry, brittle toenails. Do not apply lotion between your toes.  Trim your toenails straight across. Do not dig under them or around the cuticle. File the edges of your nails with an emery board or nail file.  Do not cut corns or calluses or try to remove them with medicine.  Wear clean socks or stockings every day. Make sure they are not too tight. Do not wear knee-high stockings since they may decrease blood flow to your legs.  Wear shoes that fit properly and have enough cushioning. To break in new shoes, wear them for just a few hours a day. This prevents you from injuring your feet. Always look in your shoes before you put them on to be sure there are no objects inside.  Do not cross your legs. This may decrease the blood flow to your feet.  If you find a minor scrape,  cut, or break in the skin on your feet, keep it and the skin around it clean and dry. These areas may be cleansed with mild soap and water. Do not cleanse the area with peroxide, alcohol, or iodine.  When you remove an adhesive bandage, be sure not to damage the skin around it.  If you have a wound, look at it several times a day to make sure it is healing.  Do not use heating pads or hot water bottles. They may burn your skin. If you have lost feeling in your feet or legs, you may not know it is happening until it is too late.  Make sure your health care provider performs a complete foot exam at least annually or more often if you have foot problems. Report any cuts, sores, or bruises to your health care provider immediately. SEEK MEDICAL CARE IF:   You have an injury that is not healing.  You have cuts or breaks in the skin.  You have an ingrown nail.  You notice redness on your legs or feet.  You feel burning or tingling in your legs or feet.  You have pain or cramps in your legs and feet.  Your legs or feet are numb.  Your feet always feel cold. SEEK IMMEDIATE MEDICAL CARE IF:   There is increasing redness,   swelling, or pain in or around a wound.  There is a red line that goes up your leg.  Pus is coming from a wound.  You develop a fever or as directed by your health care provider.  You notice a bad smell coming from an ulcer or wound. Document Released: 08/12/2000 Document Revised: 04/17/2013 Document Reviewed: 01/22/2013 [employer] Patient Information 2014 [employer]  

## 2014-01-28 ENCOUNTER — Encounter: Payer: Self-pay | Admitting: *Deleted

## 2014-01-28 ENCOUNTER — Ambulatory Visit (INDEPENDENT_AMBULATORY_CARE_PROVIDER_SITE_OTHER): Payer: Medicare Other | Admitting: Cardiology

## 2014-01-28 VITALS — BP 108/75 | HR 68 | Wt 134.0 lb

## 2014-01-28 DIAGNOSIS — I471 Supraventricular tachycardia: Secondary | ICD-10-CM

## 2014-01-28 DIAGNOSIS — D510 Vitamin B12 deficiency anemia due to intrinsic factor deficiency: Secondary | ICD-10-CM | POA: Diagnosis not present

## 2014-01-28 DIAGNOSIS — I951 Orthostatic hypotension: Secondary | ICD-10-CM

## 2014-01-28 DIAGNOSIS — I498 Other specified cardiac arrhythmias: Secondary | ICD-10-CM | POA: Diagnosis not present

## 2014-01-28 MED ORDER — METOPROLOL TARTRATE 25 MG PO TABS: 25.0000 mg | ORAL_TABLET | Freq: Every day | ORAL | Status: DC
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
## 2014-01-28 NOTE — Patient Instructions (Signed)
Your physician wants you to follow-up in: 6 MONTHS WITH DR.ALLRED You will receive a reminder letter in the mail two months in advance. If you don't receive a letter, please call our office to schedule the follow-up appointment.  Your physician recommends that you continue on your current medications as directed. Please refer to the Current Medication list given to you today.  

## 2014-01-29 ENCOUNTER — Encounter: Payer: Self-pay | Admitting: Cardiology

## 2014-01-31 ENCOUNTER — Ambulatory Visit: Payer: Medicare Other | Admitting: Internal Medicine

## 2014-02-25 DIAGNOSIS — D510 Vitamin B12 deficiency anemia due to intrinsic factor deficiency: Secondary | ICD-10-CM | POA: Diagnosis not present

## 2014-03-04 ENCOUNTER — Ambulatory Visit: Payer: BC Managed Care – PPO | Admitting: Podiatrist

## 2014-03-11 ENCOUNTER — Ambulatory Visit (INDEPENDENT_AMBULATORY_CARE_PROVIDER_SITE_OTHER): Payer: Medicare Other | Admitting: Podiatrist

## 2014-03-11 ENCOUNTER — Encounter: Payer: Self-pay | Admitting: Podiatrist

## 2014-03-11 ENCOUNTER — Ambulatory Visit: Payer: BC Managed Care – PPO | Admitting: Podiatrist

## 2014-03-11 VITALS — BP 126/67 | HR 70 | Resp 18

## 2014-03-11 DIAGNOSIS — B351 Tinea unguium: Secondary | ICD-10-CM | POA: Diagnosis not present

## 2014-03-11 DIAGNOSIS — E119 Type 2 diabetes mellitus without complications: Secondary | ICD-10-CM

## 2014-03-11 DIAGNOSIS — M79673 Pain in unspecified foot: Secondary | ICD-10-CM

## 2014-03-11 DIAGNOSIS — M79609 Pain in unspecified limb: Secondary | ICD-10-CM

## 2014-03-11 NOTE — Progress Notes (Signed)
I AM HERE TO GET MY TOENAILS TRIMMED UP   HPI:  Patient presents today for follow up of foot and nail care. Denies any new complaints today.  Objective:  Patients chart is reviewed.  Vascular status reveals pedal pulses noted at 1 out of 4 dp and pt bilateral .  Neurological sensation is Normal to [employer] monofilament bilateral.  Patients nails are thickened, discolored, distrophic, friable and brittle with yellow-brown discoloration. Patient subjectively relates they are painful with shoes and with ambulation of bilateral feet.  Assessment:  Symptomatic onychomycosis  Plan:  Discussed treatment options and alternatives.  The symptomatic toenails were debrided through manual an mechanical means without complication.  Return appointment recommended at routine intervals of 3 months    Trudie Buckler, DPM

## 2014-03-24 DIAGNOSIS — IMO0001 Reserved for inherently not codable concepts without codable children: Secondary | ICD-10-CM | POA: Diagnosis not present

## 2014-03-24 DIAGNOSIS — D510 Vitamin B12 deficiency anemia due to intrinsic factor deficiency: Secondary | ICD-10-CM | POA: Diagnosis not present

## 2014-04-04 DIAGNOSIS — IMO0001 Reserved for inherently not codable concepts without codable children: Secondary | ICD-10-CM | POA: Diagnosis not present

## 2014-04-15 ENCOUNTER — Ambulatory Visit: Payer: Medicare Other | Admitting: Podiatrist

## 2014-04-24 DIAGNOSIS — D510 Vitamin B12 deficiency anemia due to intrinsic factor deficiency: Secondary | ICD-10-CM | POA: Diagnosis not present

## 2014-04-25 DIAGNOSIS — H02109 Unspecified ectropion of unspecified eye, unspecified eyelid: Secondary | ICD-10-CM | POA: Diagnosis not present

## 2014-05-16 DIAGNOSIS — Z23 Encounter for immunization: Secondary | ICD-10-CM | POA: Diagnosis not present

## 2014-05-22 DIAGNOSIS — D510 Vitamin B12 deficiency anemia due to intrinsic factor deficiency: Secondary | ICD-10-CM | POA: Diagnosis not present

## 2014-05-22 DIAGNOSIS — IMO0001 Reserved for inherently not codable concepts without codable children: Secondary | ICD-10-CM | POA: Diagnosis not present

## 2014-05-28 DIAGNOSIS — E1101 Type 2 diabetes mellitus with hyperosmolarity with coma: Secondary | ICD-10-CM | POA: Diagnosis not present

## 2014-05-28 DIAGNOSIS — E162 Hypoglycemia, unspecified: Secondary | ICD-10-CM | POA: Diagnosis not present

## 2014-06-10 DIAGNOSIS — H40013 Open angle with borderline findings, low risk, bilateral: Secondary | ICD-10-CM | POA: Diagnosis not present

## 2014-06-23 ENCOUNTER — Encounter: Payer: Self-pay | Admitting: Internal Medicine

## 2014-06-23 ENCOUNTER — Ambulatory Visit (INDEPENDENT_AMBULATORY_CARE_PROVIDER_SITE_OTHER): Payer: Medicare Other | Admitting: Internal Medicine

## 2014-06-23 VITALS — BP 102/62 | HR 78 | Ht 62.0 in | Wt 135.2 lb

## 2014-06-23 DIAGNOSIS — R072 Precordial pain: Secondary | ICD-10-CM | POA: Diagnosis not present

## 2014-06-23 DIAGNOSIS — I471 Supraventricular tachycardia, unspecified: Secondary | ICD-10-CM

## 2014-06-23 DIAGNOSIS — I951 Orthostatic hypotension: Secondary | ICD-10-CM

## 2014-06-23 NOTE — Patient Instructions (Signed)
Your physician wants you to follow-up in: 12 months with Dr Allred You will receive a reminder letter in the mail two months in advance. If you don't receive a letter, please call our office to schedule the follow-up appointment.  
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
## 2014-06-24 ENCOUNTER — Encounter: Payer: Self-pay | Admitting: Podiatrist

## 2014-06-24 ENCOUNTER — Ambulatory Visit (INDEPENDENT_AMBULATORY_CARE_PROVIDER_SITE_OTHER): Payer: Medicare Other | Admitting: Podiatrist

## 2014-06-24 DIAGNOSIS — B351 Tinea unguium: Secondary | ICD-10-CM | POA: Diagnosis not present

## 2014-06-24 DIAGNOSIS — D510 Vitamin B12 deficiency anemia due to intrinsic factor deficiency: Secondary | ICD-10-CM | POA: Diagnosis not present

## 2014-06-24 DIAGNOSIS — M79676 Pain in unspecified toe(s): Secondary | ICD-10-CM

## 2014-06-24 NOTE — Progress Notes (Signed)
HPI: Patient presents today for follow up of foot and nail care. Denies any new complaints today.  Objective: Patients chart is reviewed. Vascular status reveals pedal pulses noted at 1 out of 4 dp and pt bilateral . Neurological sensation is Normal to [employer] monofilament bilateral. Patients nails are thickened, discolored, distrophic, friable and brittle with yellow-brown discoloration. Patient subjectively relates they are painful with shoes and with ambulation of bilateral feet.  Assessment: Symptomatic onychomycosis  Plan: Discussed treatment options and alternatives. The symptomatic toenails were debrided through manual an mechanical means without complication. Return appointment recommended at routine intervals of 3 months

## 2014-07-15 DIAGNOSIS — H02102 Unspecified ectropion of right lower eyelid: Secondary | ICD-10-CM | POA: Diagnosis not present

## 2014-07-15 DIAGNOSIS — H02105 Unspecified ectropion of left lower eyelid: Secondary | ICD-10-CM | POA: Diagnosis not present

## 2014-07-22 DIAGNOSIS — D510 Vitamin B12 deficiency anemia due to intrinsic factor deficiency: Secondary | ICD-10-CM | POA: Diagnosis not present

## 2014-08-20 DIAGNOSIS — E1165 Type 2 diabetes mellitus with hyperglycemia: Secondary | ICD-10-CM | POA: Diagnosis not present

## 2014-08-20 DIAGNOSIS — D510 Vitamin B12 deficiency anemia due to intrinsic factor deficiency: Secondary | ICD-10-CM | POA: Diagnosis not present

## 2014-08-27 DIAGNOSIS — E1140 Type 2 diabetes mellitus with diabetic neuropathy, unspecified: Secondary | ICD-10-CM | POA: Diagnosis not present

## 2014-08-27 DIAGNOSIS — G903 Multi-system degeneration of the autonomic nervous system: Secondary | ICD-10-CM | POA: Diagnosis not present

## 2014-08-27 DIAGNOSIS — E1165 Type 2 diabetes mellitus with hyperglycemia: Secondary | ICD-10-CM | POA: Diagnosis not present

## 2014-09-05 ENCOUNTER — Ambulatory Visit (INDEPENDENT_AMBULATORY_CARE_PROVIDER_SITE_OTHER): Payer: Medicare Other

## 2014-09-05 ENCOUNTER — Telehealth: Payer: Self-pay | Admitting: Internal Medicine

## 2014-09-05 VITALS — BP 109/64 | HR 70 | Resp 18

## 2014-09-05 DIAGNOSIS — E1140 Type 2 diabetes mellitus with diabetic neuropathy, unspecified: Secondary | ICD-10-CM

## 2014-09-05 DIAGNOSIS — M79676 Pain in unspecified toe(s): Secondary | ICD-10-CM | POA: Diagnosis not present

## 2014-09-05 DIAGNOSIS — B351 Tinea unguium: Secondary | ICD-10-CM | POA: Diagnosis not present

## 2014-09-05 DIAGNOSIS — M79673 Pain in unspecified foot: Secondary | ICD-10-CM

## 2014-09-05 MED ORDER — METOPROLOL TARTRATE 25 MG PO TABS: 12.5000 mg | ORAL_TABLET | Freq: Every day | ORAL | Status: DC

## 2014-09-05 NOTE — Telephone Encounter (Signed)
New message      Pt c/o medication issue: 1. Name of Medication: metoprolol 2. How are you currently taking this medication (dosage and times per day)? Was 1 tab daily----now 1/2 tab daily 3. Are you having a reaction (difficulty breathing--STAT)?  no 4. What is your medication issue? Pt had a drop in blood pressure from sitting to standing.  PCP want her to cut medication in half.  Is this ok with Dr [name]?

## 2014-09-05 NOTE — Progress Notes (Signed)
   Subjective:    Patient ID: Laura Swanson, female    DOB: 07/09/1925, 81 y.o.   MRN: [id number]  HPI I AM HERE TO GET MY TOENAILS TRIMMED UP AND I HAVE HAD ISSUES WITH MY BS BUT IT IS UNDER CONTROL NOW AND RUNS AROUND 116    Review of Systems no new findings or systemic changes noted     Objective:   Physical Exam Lower extremity objective findings reveal vascular status is intact pedal pulses DP and PT one over 4 bilateral Refill time 3 seconds epicritic sensations diminished to the toes and forefoot on Semmes Weinstein testing. Plantar response DTRs not listed. Dermatologically skin color pigment normal hair growth normal absent nails criptotic incurvated brittle tender both on palpation touch and debridement 1 through 4 bilateral no open wounds no ulcers no secondary infections. Patient does have mild semirigid digital contractures mild HAV deformity although asymptomatic.       Assessment & Plan:  Assessment diabetes with complications her blood glucose are getting much better managed currently on metformin has been tried on a variety of different medicines. At this time thick brittle crumbly dystrophic criptotic nails 1 through 5 bilateral debrided and the presence of pain symptomology as well as diabetes and complications return for follow care every 3 months as recommended  Harriet Masson DPM

## 2014-09-05 NOTE — Telephone Encounter (Signed)
Spoke with her daughter and she states she took her mom to Dr. Nicki Reaper and her BP dropped for 124/-88/ when she stood.  Dr Nicki Reaper has asked that she decrease her Metoprolol to 12.5mg  daily and she is following up with him in a few weeks.

## 2014-09-08 DIAGNOSIS — Z1231 Encounter for screening mammogram for malignant neoplasm of breast: Secondary | ICD-10-CM | POA: Diagnosis not present

## 2014-09-08 DIAGNOSIS — Z803 Family history of malignant neoplasm of breast: Secondary | ICD-10-CM | POA: Diagnosis not present

## 2014-09-22 DIAGNOSIS — G903 Multi-system degeneration of the autonomic nervous system: Secondary | ICD-10-CM | POA: Diagnosis not present

## 2014-09-22 DIAGNOSIS — E1165 Type 2 diabetes mellitus with hyperglycemia: Secondary | ICD-10-CM | POA: Diagnosis not present

## 2014-09-22 DIAGNOSIS — D510 Vitamin B12 deficiency anemia due to intrinsic factor deficiency: Secondary | ICD-10-CM | POA: Diagnosis not present

## 2014-10-23 DIAGNOSIS — Z760 Encounter for issue of repeat prescription: Secondary | ICD-10-CM | POA: Diagnosis not present

## 2014-10-23 DIAGNOSIS — D510 Vitamin B12 deficiency anemia due to intrinsic factor deficiency: Secondary | ICD-10-CM | POA: Diagnosis not present

## 2014-10-23 DIAGNOSIS — G903 Multi-system degeneration of the autonomic nervous system: Secondary | ICD-10-CM | POA: Diagnosis not present

## 2014-11-18 DIAGNOSIS — E1165 Type 2 diabetes mellitus with hyperglycemia: Secondary | ICD-10-CM | POA: Diagnosis not present

## 2014-11-18 DIAGNOSIS — D510 Vitamin B12 deficiency anemia due to intrinsic factor deficiency: Secondary | ICD-10-CM | POA: Diagnosis not present

## 2014-11-26 DIAGNOSIS — G903 Multi-system degeneration of the autonomic nervous system: Secondary | ICD-10-CM | POA: Diagnosis not present

## 2014-11-26 DIAGNOSIS — D649 Anemia, unspecified: Secondary | ICD-10-CM | POA: Diagnosis not present

## 2014-11-26 DIAGNOSIS — Z1389 Encounter for screening for other disorder: Secondary | ICD-10-CM | POA: Diagnosis not present

## 2014-11-26 DIAGNOSIS — Z9181 History of falling: Secondary | ICD-10-CM | POA: Diagnosis not present

## 2014-11-26 DIAGNOSIS — D509 Iron deficiency anemia, unspecified: Secondary | ICD-10-CM | POA: Diagnosis not present

## 2014-11-26 DIAGNOSIS — R413 Other amnesia: Secondary | ICD-10-CM | POA: Diagnosis not present

## 2014-11-26 DIAGNOSIS — E1165 Type 2 diabetes mellitus with hyperglycemia: Secondary | ICD-10-CM | POA: Diagnosis not present

## 2014-11-27 ENCOUNTER — Ambulatory Visit (INDEPENDENT_AMBULATORY_CARE_PROVIDER_SITE_OTHER): Payer: Medicare Other

## 2014-11-27 VITALS — BP 142/86 | HR 69 | Resp 12

## 2014-11-27 DIAGNOSIS — B351 Tinea unguium: Secondary | ICD-10-CM

## 2014-11-27 DIAGNOSIS — E1140 Type 2 diabetes mellitus with diabetic neuropathy, unspecified: Secondary | ICD-10-CM | POA: Diagnosis not present

## 2014-11-27 DIAGNOSIS — M79676 Pain in unspecified toe(s): Secondary | ICD-10-CM

## 2014-11-27 NOTE — Progress Notes (Signed)
   Subjective:    Patient ID: Laura Swanson, female    DOB: [birth year], 81 y.o.   MRN: [id number]  HPI PT REQUESTING FOR TOENAIL DEBRIDEMENT.   Review of Systems no new findings or systemic changes     Objective:   Physical Exam Vascular status reveals pedal pulses DP and PT one over 4 mild varicosities no edema noted nails thick criptotic incurvated brittle one through 5 bilateral no open wounds no ulcers no secondary infections decreased sensation of the forefoot toes and arch bilateral normal plantar response DTRs not elicited. There is mild HAV deformity patient wearing accommodative type athletic shoes at all times       Assessment & Plan:  Assessment diabetes with complications history of peripheral neuropathy decreased sensation and basket her status otherwise stable thick brittle [place] friable mycotic nails with discoloration and brittleness and sensitivity debrided 1 through 5 bilateral return for future palliative care every 3 months as recommended  Harriet Masson DPM

## 2014-12-09 DIAGNOSIS — H40013 Open angle with borderline findings, low risk, bilateral: Secondary | ICD-10-CM | POA: Diagnosis not present

## 2014-12-23 DIAGNOSIS — D510 Vitamin B12 deficiency anemia due to intrinsic factor deficiency: Secondary | ICD-10-CM | POA: Diagnosis not present

## 2015-01-20 DIAGNOSIS — D510 Vitamin B12 deficiency anemia due to intrinsic factor deficiency: Secondary | ICD-10-CM | POA: Diagnosis not present

## 2015-02-03 DIAGNOSIS — R1032 Left lower quadrant pain: Secondary | ICD-10-CM | POA: Diagnosis not present

## 2015-02-03 DIAGNOSIS — D649 Anemia, unspecified: Secondary | ICD-10-CM | POA: Diagnosis not present

## 2015-02-04 DIAGNOSIS — E780 Pure hypercholesterolemia: Secondary | ICD-10-CM | POA: Diagnosis present

## 2015-02-04 DIAGNOSIS — E1165 Type 2 diabetes mellitus with hyperglycemia: Secondary | ICD-10-CM | POA: Diagnosis present

## 2015-02-04 DIAGNOSIS — I517 Cardiomegaly: Secondary | ICD-10-CM | POA: Diagnosis not present

## 2015-02-04 DIAGNOSIS — G8194 Hemiplegia, unspecified affecting left nondominant side: Secondary | ICD-10-CM | POA: Diagnosis present

## 2015-02-04 DIAGNOSIS — D62 Acute posthemorrhagic anemia: Secondary | ICD-10-CM | POA: Diagnosis not present

## 2015-02-04 DIAGNOSIS — R195 Other fecal abnormalities: Secondary | ICD-10-CM | POA: Diagnosis not present

## 2015-02-04 DIAGNOSIS — N200 Calculus of kidney: Secondary | ICD-10-CM | POA: Diagnosis not present

## 2015-02-04 DIAGNOSIS — K5792 Diverticulitis of intestine, part unspecified, without perforation or abscess without bleeding: Secondary | ICD-10-CM | POA: Diagnosis not present

## 2015-02-04 DIAGNOSIS — Z8572 Personal history of non-Hodgkin lymphomas: Secondary | ICD-10-CM | POA: Diagnosis not present

## 2015-02-04 DIAGNOSIS — R4781 Slurred speech: Secondary | ICD-10-CM | POA: Diagnosis present

## 2015-02-04 DIAGNOSIS — D649 Anemia, unspecified: Secondary | ICD-10-CM | POA: Diagnosis not present

## 2015-02-04 DIAGNOSIS — I639 Cerebral infarction, unspecified: Secondary | ICD-10-CM | POA: Diagnosis present

## 2015-02-04 DIAGNOSIS — R29810 Facial weakness: Secondary | ICD-10-CM | POA: Diagnosis present

## 2015-02-04 DIAGNOSIS — K5900 Constipation, unspecified: Secondary | ICD-10-CM | POA: Diagnosis present

## 2015-02-04 DIAGNOSIS — K922 Gastrointestinal hemorrhage, unspecified: Secondary | ICD-10-CM | POA: Diagnosis not present

## 2015-02-04 DIAGNOSIS — R1032 Left lower quadrant pain: Secondary | ICD-10-CM | POA: Diagnosis not present

## 2015-02-04 DIAGNOSIS — K5793 Diverticulitis of intestine, part unspecified, without perforation or abscess with bleeding: Secondary | ICD-10-CM | POA: Diagnosis not present

## 2015-02-04 DIAGNOSIS — R200 Anesthesia of skin: Secondary | ICD-10-CM | POA: Diagnosis not present

## 2015-02-04 DIAGNOSIS — Z79899 Other long term (current) drug therapy: Secondary | ICD-10-CM | POA: Diagnosis not present

## 2015-02-04 DIAGNOSIS — R209 Unspecified disturbances of skin sensation: Secondary | ICD-10-CM | POA: Diagnosis not present

## 2015-02-04 DIAGNOSIS — M1990 Unspecified osteoarthritis, unspecified site: Secondary | ICD-10-CM | POA: Diagnosis present

## 2015-02-04 DIAGNOSIS — I10 Essential (primary) hypertension: Secondary | ICD-10-CM | POA: Diagnosis present

## 2015-02-04 DIAGNOSIS — I252 Old myocardial infarction: Secondary | ICD-10-CM | POA: Diagnosis not present

## 2015-02-04 DIAGNOSIS — E2740 Unspecified adrenocortical insufficiency: Secondary | ICD-10-CM | POA: Diagnosis present

## 2015-02-04 DIAGNOSIS — I4891 Unspecified atrial fibrillation: Secondary | ICD-10-CM | POA: Diagnosis present

## 2015-02-04 DIAGNOSIS — K219 Gastro-esophageal reflux disease without esophagitis: Secondary | ICD-10-CM | POA: Diagnosis present

## 2015-02-04 DIAGNOSIS — E039 Hypothyroidism, unspecified: Secondary | ICD-10-CM | POA: Diagnosis present

## 2015-02-04 DIAGNOSIS — K5732 Diverticulitis of large intestine without perforation or abscess without bleeding: Secondary | ICD-10-CM | POA: Diagnosis not present

## 2015-02-04 DIAGNOSIS — C884 Extranodal marginal zone B-cell lymphoma of mucosa-associated lymphoid tissue [MALT-lymphoma]: Secondary | ICD-10-CM | POA: Diagnosis not present

## 2015-02-09 DIAGNOSIS — K5792 Diverticulitis of intestine, part unspecified, without perforation or abscess without bleeding: Secondary | ICD-10-CM | POA: Diagnosis not present

## 2015-02-09 DIAGNOSIS — K922 Gastrointestinal hemorrhage, unspecified: Secondary | ICD-10-CM | POA: Diagnosis not present

## 2015-02-09 DIAGNOSIS — I4891 Unspecified atrial fibrillation: Secondary | ICD-10-CM | POA: Diagnosis not present

## 2015-02-09 DIAGNOSIS — G8194 Hemiplegia, unspecified affecting left nondominant side: Secondary | ICD-10-CM | POA: Diagnosis not present

## 2015-02-09 DIAGNOSIS — E1165 Type 2 diabetes mellitus with hyperglycemia: Secondary | ICD-10-CM | POA: Diagnosis not present

## 2015-02-09 DIAGNOSIS — I10 Essential (primary) hypertension: Secondary | ICD-10-CM | POA: Diagnosis not present

## 2015-02-10 DIAGNOSIS — S098XXA Other specified injuries of head, initial encounter: Secondary | ICD-10-CM | POA: Diagnosis not present

## 2015-02-10 DIAGNOSIS — S3992XA Unspecified injury of lower back, initial encounter: Secondary | ICD-10-CM | POA: Diagnosis not present

## 2015-02-10 DIAGNOSIS — S299XXA Unspecified injury of thorax, initial encounter: Secondary | ICD-10-CM | POA: Diagnosis not present

## 2015-02-10 DIAGNOSIS — S300XXA Contusion of lower back and pelvis, initial encounter: Secondary | ICD-10-CM | POA: Diagnosis not present

## 2015-02-10 DIAGNOSIS — S0990XA Unspecified injury of head, initial encounter: Secondary | ICD-10-CM | POA: Diagnosis not present

## 2015-02-10 DIAGNOSIS — R55 Syncope and collapse: Secondary | ICD-10-CM | POA: Diagnosis not present

## 2015-02-10 DIAGNOSIS — M545 Low back pain: Secondary | ICD-10-CM | POA: Diagnosis not present

## 2015-02-10 DIAGNOSIS — G4489 Other headache syndrome: Secondary | ICD-10-CM | POA: Diagnosis not present

## 2015-02-10 DIAGNOSIS — S161XXA Strain of muscle, fascia and tendon at neck level, initial encounter: Secondary | ICD-10-CM | POA: Diagnosis not present

## 2015-02-11 DIAGNOSIS — I4891 Unspecified atrial fibrillation: Secondary | ICD-10-CM | POA: Diagnosis not present

## 2015-02-11 DIAGNOSIS — G8194 Hemiplegia, unspecified affecting left nondominant side: Secondary | ICD-10-CM | POA: Diagnosis not present

## 2015-02-11 DIAGNOSIS — E1165 Type 2 diabetes mellitus with hyperglycemia: Secondary | ICD-10-CM | POA: Diagnosis not present

## 2015-02-11 DIAGNOSIS — K922 Gastrointestinal hemorrhage, unspecified: Secondary | ICD-10-CM | POA: Diagnosis not present

## 2015-02-11 DIAGNOSIS — K5792 Diverticulitis of intestine, part unspecified, without perforation or abscess without bleeding: Secondary | ICD-10-CM | POA: Diagnosis not present

## 2015-02-11 DIAGNOSIS — I10 Essential (primary) hypertension: Secondary | ICD-10-CM | POA: Diagnosis not present

## 2015-02-11 DIAGNOSIS — R600 Localized edema: Secondary | ICD-10-CM | POA: Diagnosis not present

## 2015-02-12 DIAGNOSIS — D62 Acute posthemorrhagic anemia: Secondary | ICD-10-CM | POA: Diagnosis not present

## 2015-02-12 DIAGNOSIS — E876 Hypokalemia: Secondary | ICD-10-CM | POA: Diagnosis not present

## 2015-02-12 DIAGNOSIS — C884 Extranodal marginal zone B-cell lymphoma of mucosa-associated lymphoid tissue [MALT-lymphoma]: Secondary | ICD-10-CM | POA: Diagnosis present

## 2015-02-12 DIAGNOSIS — K219 Gastro-esophageal reflux disease without esophagitis: Secondary | ICD-10-CM | POA: Diagnosis present

## 2015-02-12 DIAGNOSIS — Z886 Allergy status to analgesic agent status: Secondary | ICD-10-CM | POA: Diagnosis not present

## 2015-02-12 DIAGNOSIS — E890 Postprocedural hypothyroidism: Secondary | ICD-10-CM | POA: Diagnosis present

## 2015-02-12 DIAGNOSIS — I10 Essential (primary) hypertension: Secondary | ICD-10-CM | POA: Diagnosis not present

## 2015-02-12 DIAGNOSIS — E780 Pure hypercholesterolemia: Secondary | ICD-10-CM | POA: Diagnosis present

## 2015-02-12 DIAGNOSIS — K5791 Diverticulosis of intestine, part unspecified, without perforation or abscess with bleeding: Secondary | ICD-10-CM | POA: Diagnosis present

## 2015-02-12 DIAGNOSIS — G8194 Hemiplegia, unspecified affecting left nondominant side: Secondary | ICD-10-CM | POA: Diagnosis not present

## 2015-02-12 DIAGNOSIS — Z91041 Radiographic dye allergy status: Secondary | ICD-10-CM | POA: Diagnosis not present

## 2015-02-12 DIAGNOSIS — I252 Old myocardial infarction: Secondary | ICD-10-CM | POA: Diagnosis not present

## 2015-02-12 DIAGNOSIS — M549 Dorsalgia, unspecified: Secondary | ICD-10-CM | POA: Diagnosis present

## 2015-02-12 DIAGNOSIS — R05 Cough: Secondary | ICD-10-CM | POA: Diagnosis not present

## 2015-02-12 DIAGNOSIS — K5792 Diverticulitis of intestine, part unspecified, without perforation or abscess without bleeding: Secondary | ICD-10-CM | POA: Diagnosis not present

## 2015-02-12 DIAGNOSIS — D649 Anemia, unspecified: Secondary | ICD-10-CM | POA: Diagnosis not present

## 2015-02-12 DIAGNOSIS — E1165 Type 2 diabetes mellitus with hyperglycemia: Secondary | ICD-10-CM | POA: Diagnosis not present

## 2015-02-12 DIAGNOSIS — Z8673 Personal history of transient ischemic attack (TIA), and cerebral infarction without residual deficits: Secondary | ICD-10-CM | POA: Diagnosis not present

## 2015-02-12 DIAGNOSIS — I951 Orthostatic hypotension: Secondary | ICD-10-CM | POA: Diagnosis not present

## 2015-02-12 DIAGNOSIS — Z8711 Personal history of peptic ulcer disease: Secondary | ICD-10-CM | POA: Diagnosis not present

## 2015-02-12 DIAGNOSIS — K922 Gastrointestinal hemorrhage, unspecified: Secondary | ICD-10-CM | POA: Diagnosis not present

## 2015-02-12 DIAGNOSIS — M1990 Unspecified osteoarthritis, unspecified site: Secondary | ICD-10-CM | POA: Diagnosis present

## 2015-02-12 DIAGNOSIS — I4891 Unspecified atrial fibrillation: Secondary | ICD-10-CM | POA: Diagnosis present

## 2015-02-12 DIAGNOSIS — Z888 Allergy status to other drugs, medicaments and biological substances status: Secondary | ICD-10-CM | POA: Diagnosis not present

## 2015-02-12 DIAGNOSIS — I2510 Atherosclerotic heart disease of native coronary artery without angina pectoris: Secondary | ICD-10-CM | POA: Diagnosis present

## 2015-02-12 DIAGNOSIS — M6281 Muscle weakness (generalized): Secondary | ICD-10-CM | POA: Diagnosis not present

## 2015-02-12 DIAGNOSIS — D510 Vitamin B12 deficiency anemia due to intrinsic factor deficiency: Secondary | ICD-10-CM | POA: Diagnosis not present

## 2015-02-12 DIAGNOSIS — K5733 Diverticulitis of large intestine without perforation or abscess with bleeding: Secondary | ICD-10-CM | POA: Diagnosis not present

## 2015-02-12 DIAGNOSIS — K5900 Constipation, unspecified: Secondary | ICD-10-CM | POA: Diagnosis present

## 2015-02-12 DIAGNOSIS — K921 Melena: Secondary | ICD-10-CM | POA: Diagnosis not present

## 2015-02-15 DIAGNOSIS — D62 Acute posthemorrhagic anemia: Secondary | ICD-10-CM | POA: Diagnosis not present

## 2015-02-15 DIAGNOSIS — K5792 Diverticulitis of intestine, part unspecified, without perforation or abscess without bleeding: Secondary | ICD-10-CM | POA: Diagnosis not present

## 2015-02-15 DIAGNOSIS — M6281 Muscle weakness (generalized): Secondary | ICD-10-CM | POA: Diagnosis not present

## 2015-02-15 DIAGNOSIS — K5791 Diverticulosis of intestine, part unspecified, without perforation or abscess with bleeding: Secondary | ICD-10-CM | POA: Diagnosis not present

## 2015-02-15 DIAGNOSIS — I119 Hypertensive heart disease without heart failure: Secondary | ICD-10-CM | POA: Diagnosis not present

## 2015-02-15 DIAGNOSIS — K922 Gastrointestinal hemorrhage, unspecified: Secondary | ICD-10-CM | POA: Diagnosis not present

## 2015-02-15 DIAGNOSIS — E876 Hypokalemia: Secondary | ICD-10-CM | POA: Diagnosis not present

## 2015-02-15 DIAGNOSIS — I10 Essential (primary) hypertension: Secondary | ICD-10-CM | POA: Diagnosis not present

## 2015-02-16 DIAGNOSIS — K922 Gastrointestinal hemorrhage, unspecified: Secondary | ICD-10-CM | POA: Diagnosis not present

## 2015-02-16 DIAGNOSIS — K5792 Diverticulitis of intestine, part unspecified, without perforation or abscess without bleeding: Secondary | ICD-10-CM | POA: Diagnosis not present

## 2015-02-16 DIAGNOSIS — I119 Hypertensive heart disease without heart failure: Secondary | ICD-10-CM | POA: Diagnosis not present

## 2015-02-16 DIAGNOSIS — D62 Acute posthemorrhagic anemia: Secondary | ICD-10-CM | POA: Diagnosis not present

## 2015-02-23 ENCOUNTER — Ambulatory Visit: Payer: Medicare Other | Admitting: Podiatry

## 2015-02-23 ENCOUNTER — Ambulatory Visit: Payer: Medicare Other

## 2015-02-26 DIAGNOSIS — K5792 Diverticulitis of intestine, part unspecified, without perforation or abscess without bleeding: Secondary | ICD-10-CM | POA: Diagnosis not present

## 2015-02-26 DIAGNOSIS — E1165 Type 2 diabetes mellitus with hyperglycemia: Secondary | ICD-10-CM | POA: Diagnosis not present

## 2015-02-26 DIAGNOSIS — I10 Essential (primary) hypertension: Secondary | ICD-10-CM | POA: Diagnosis not present

## 2015-02-26 DIAGNOSIS — I4891 Unspecified atrial fibrillation: Secondary | ICD-10-CM | POA: Diagnosis not present

## 2015-02-26 DIAGNOSIS — K922 Gastrointestinal hemorrhage, unspecified: Secondary | ICD-10-CM | POA: Diagnosis not present

## 2015-02-26 DIAGNOSIS — G8194 Hemiplegia, unspecified affecting left nondominant side: Secondary | ICD-10-CM | POA: Diagnosis not present

## 2015-02-27 DIAGNOSIS — K5792 Diverticulitis of intestine, part unspecified, without perforation or abscess without bleeding: Secondary | ICD-10-CM | POA: Diagnosis not present

## 2015-02-27 DIAGNOSIS — K922 Gastrointestinal hemorrhage, unspecified: Secondary | ICD-10-CM | POA: Diagnosis not present

## 2015-02-27 DIAGNOSIS — I4891 Unspecified atrial fibrillation: Secondary | ICD-10-CM | POA: Diagnosis not present

## 2015-02-27 DIAGNOSIS — I10 Essential (primary) hypertension: Secondary | ICD-10-CM | POA: Diagnosis not present

## 2015-02-27 DIAGNOSIS — E1165 Type 2 diabetes mellitus with hyperglycemia: Secondary | ICD-10-CM | POA: Diagnosis not present

## 2015-02-27 DIAGNOSIS — G8194 Hemiplegia, unspecified affecting left nondominant side: Secondary | ICD-10-CM | POA: Diagnosis not present

## 2015-03-03 DIAGNOSIS — I4891 Unspecified atrial fibrillation: Secondary | ICD-10-CM | POA: Diagnosis not present

## 2015-03-03 DIAGNOSIS — K922 Gastrointestinal hemorrhage, unspecified: Secondary | ICD-10-CM | POA: Diagnosis not present

## 2015-03-03 DIAGNOSIS — G8194 Hemiplegia, unspecified affecting left nondominant side: Secondary | ICD-10-CM | POA: Diagnosis not present

## 2015-03-03 DIAGNOSIS — E1165 Type 2 diabetes mellitus with hyperglycemia: Secondary | ICD-10-CM | POA: Diagnosis not present

## 2015-03-03 DIAGNOSIS — I10 Essential (primary) hypertension: Secondary | ICD-10-CM | POA: Diagnosis not present

## 2015-03-03 DIAGNOSIS — K5792 Diverticulitis of intestine, part unspecified, without perforation or abscess without bleeding: Secondary | ICD-10-CM | POA: Diagnosis not present

## 2015-03-05 DIAGNOSIS — K922 Gastrointestinal hemorrhage, unspecified: Secondary | ICD-10-CM | POA: Diagnosis not present

## 2015-03-05 DIAGNOSIS — E1165 Type 2 diabetes mellitus with hyperglycemia: Secondary | ICD-10-CM | POA: Diagnosis not present

## 2015-03-05 DIAGNOSIS — K5792 Diverticulitis of intestine, part unspecified, without perforation or abscess without bleeding: Secondary | ICD-10-CM | POA: Diagnosis not present

## 2015-03-05 DIAGNOSIS — I4891 Unspecified atrial fibrillation: Secondary | ICD-10-CM | POA: Diagnosis not present

## 2015-03-05 DIAGNOSIS — I10 Essential (primary) hypertension: Secondary | ICD-10-CM | POA: Diagnosis not present

## 2015-03-05 DIAGNOSIS — G8194 Hemiplegia, unspecified affecting left nondominant side: Secondary | ICD-10-CM | POA: Diagnosis not present

## 2015-03-07 DIAGNOSIS — I6789 Other cerebrovascular disease: Secondary | ICD-10-CM | POA: Diagnosis not present

## 2015-03-07 DIAGNOSIS — R4182 Altered mental status, unspecified: Secondary | ICD-10-CM | POA: Diagnosis not present

## 2015-03-07 DIAGNOSIS — G459 Transient cerebral ischemic attack, unspecified: Secondary | ICD-10-CM | POA: Diagnosis not present

## 2015-03-07 DIAGNOSIS — G311 Senile degeneration of brain, not elsewhere classified: Secondary | ICD-10-CM | POA: Diagnosis not present

## 2015-03-07 DIAGNOSIS — R410 Disorientation, unspecified: Secondary | ICD-10-CM | POA: Diagnosis not present

## 2015-03-07 DIAGNOSIS — F4489 Other dissociative and conversion disorders: Secondary | ICD-10-CM | POA: Diagnosis not present

## 2015-03-07 DIAGNOSIS — I517 Cardiomegaly: Secondary | ICD-10-CM | POA: Diagnosis not present

## 2015-03-09 DIAGNOSIS — G8194 Hemiplegia, unspecified affecting left nondominant side: Secondary | ICD-10-CM | POA: Diagnosis not present

## 2015-03-09 DIAGNOSIS — G459 Transient cerebral ischemic attack, unspecified: Secondary | ICD-10-CM | POA: Diagnosis not present

## 2015-03-09 DIAGNOSIS — I10 Essential (primary) hypertension: Secondary | ICD-10-CM | POA: Diagnosis not present

## 2015-03-09 DIAGNOSIS — K5792 Diverticulitis of intestine, part unspecified, without perforation or abscess without bleeding: Secondary | ICD-10-CM | POA: Diagnosis not present

## 2015-03-09 DIAGNOSIS — I4891 Unspecified atrial fibrillation: Secondary | ICD-10-CM | POA: Diagnosis not present

## 2015-03-09 DIAGNOSIS — K922 Gastrointestinal hemorrhage, unspecified: Secondary | ICD-10-CM | POA: Diagnosis not present

## 2015-03-09 DIAGNOSIS — E1165 Type 2 diabetes mellitus with hyperglycemia: Secondary | ICD-10-CM | POA: Diagnosis not present

## 2015-03-12 DIAGNOSIS — K922 Gastrointestinal hemorrhage, unspecified: Secondary | ICD-10-CM | POA: Diagnosis not present

## 2015-03-12 DIAGNOSIS — I10 Essential (primary) hypertension: Secondary | ICD-10-CM | POA: Diagnosis not present

## 2015-03-12 DIAGNOSIS — K5792 Diverticulitis of intestine, part unspecified, without perforation or abscess without bleeding: Secondary | ICD-10-CM | POA: Diagnosis not present

## 2015-03-12 DIAGNOSIS — E1165 Type 2 diabetes mellitus with hyperglycemia: Secondary | ICD-10-CM | POA: Diagnosis not present

## 2015-03-12 DIAGNOSIS — I4891 Unspecified atrial fibrillation: Secondary | ICD-10-CM | POA: Diagnosis not present

## 2015-03-12 DIAGNOSIS — G8194 Hemiplegia, unspecified affecting left nondominant side: Secondary | ICD-10-CM | POA: Diagnosis not present

## 2015-03-13 DIAGNOSIS — I4891 Unspecified atrial fibrillation: Secondary | ICD-10-CM | POA: Diagnosis not present

## 2015-03-13 DIAGNOSIS — G8194 Hemiplegia, unspecified affecting left nondominant side: Secondary | ICD-10-CM | POA: Diagnosis not present

## 2015-03-13 DIAGNOSIS — K922 Gastrointestinal hemorrhage, unspecified: Secondary | ICD-10-CM | POA: Diagnosis not present

## 2015-03-13 DIAGNOSIS — K5792 Diverticulitis of intestine, part unspecified, without perforation or abscess without bleeding: Secondary | ICD-10-CM | POA: Diagnosis not present

## 2015-03-13 DIAGNOSIS — E1165 Type 2 diabetes mellitus with hyperglycemia: Secondary | ICD-10-CM | POA: Diagnosis not present

## 2015-03-13 DIAGNOSIS — I10 Essential (primary) hypertension: Secondary | ICD-10-CM | POA: Diagnosis not present

## 2015-03-16 DIAGNOSIS — K5792 Diverticulitis of intestine, part unspecified, without perforation or abscess without bleeding: Secondary | ICD-10-CM | POA: Diagnosis not present

## 2015-03-16 DIAGNOSIS — K5900 Constipation, unspecified: Secondary | ICD-10-CM | POA: Diagnosis not present

## 2015-03-16 DIAGNOSIS — K921 Melena: Secondary | ICD-10-CM | POA: Diagnosis not present

## 2015-03-17 DIAGNOSIS — R29810 Facial weakness: Secondary | ICD-10-CM | POA: Diagnosis not present

## 2015-03-17 DIAGNOSIS — R030 Elevated blood-pressure reading, without diagnosis of hypertension: Secondary | ICD-10-CM | POA: Diagnosis not present

## 2015-03-17 DIAGNOSIS — G459 Transient cerebral ischemic attack, unspecified: Secondary | ICD-10-CM | POA: Diagnosis not present

## 2015-03-17 DIAGNOSIS — G8194 Hemiplegia, unspecified affecting left nondominant side: Secondary | ICD-10-CM | POA: Diagnosis not present

## 2015-03-17 DIAGNOSIS — J449 Chronic obstructive pulmonary disease, unspecified: Secondary | ICD-10-CM | POA: Diagnosis not present

## 2015-03-17 DIAGNOSIS — E1165 Type 2 diabetes mellitus with hyperglycemia: Secondary | ICD-10-CM | POA: Diagnosis not present

## 2015-03-17 DIAGNOSIS — I4891 Unspecified atrial fibrillation: Secondary | ICD-10-CM | POA: Diagnosis not present

## 2015-03-17 DIAGNOSIS — M1990 Unspecified osteoarthritis, unspecified site: Secondary | ICD-10-CM | POA: Diagnosis not present

## 2015-03-17 DIAGNOSIS — K5792 Diverticulitis of intestine, part unspecified, without perforation or abscess without bleeding: Secondary | ICD-10-CM | POA: Diagnosis not present

## 2015-03-17 DIAGNOSIS — E780 Pure hypercholesterolemia: Secondary | ICD-10-CM | POA: Diagnosis not present

## 2015-03-17 DIAGNOSIS — K922 Gastrointestinal hemorrhage, unspecified: Secondary | ICD-10-CM | POA: Diagnosis not present

## 2015-03-17 DIAGNOSIS — E890 Postprocedural hypothyroidism: Secondary | ICD-10-CM | POA: Diagnosis not present

## 2015-03-17 DIAGNOSIS — I517 Cardiomegaly: Secondary | ICD-10-CM | POA: Diagnosis not present

## 2015-03-17 DIAGNOSIS — E119 Type 2 diabetes mellitus without complications: Secondary | ICD-10-CM | POA: Diagnosis not present

## 2015-03-17 DIAGNOSIS — I2510 Atherosclerotic heart disease of native coronary artery without angina pectoris: Secondary | ICD-10-CM | POA: Diagnosis not present

## 2015-03-17 DIAGNOSIS — I10 Essential (primary) hypertension: Secondary | ICD-10-CM | POA: Diagnosis not present

## 2015-03-18 DIAGNOSIS — I10 Essential (primary) hypertension: Secondary | ICD-10-CM | POA: Diagnosis not present

## 2015-03-18 DIAGNOSIS — E1165 Type 2 diabetes mellitus with hyperglycemia: Secondary | ICD-10-CM | POA: Diagnosis not present

## 2015-03-18 DIAGNOSIS — I4891 Unspecified atrial fibrillation: Secondary | ICD-10-CM | POA: Diagnosis not present

## 2015-03-18 DIAGNOSIS — K922 Gastrointestinal hemorrhage, unspecified: Secondary | ICD-10-CM | POA: Diagnosis not present

## 2015-03-18 DIAGNOSIS — G8194 Hemiplegia, unspecified affecting left nondominant side: Secondary | ICD-10-CM | POA: Diagnosis not present

## 2015-03-18 DIAGNOSIS — K5792 Diverticulitis of intestine, part unspecified, without perforation or abscess without bleeding: Secondary | ICD-10-CM | POA: Diagnosis not present

## 2015-03-23 DIAGNOSIS — K922 Gastrointestinal hemorrhage, unspecified: Secondary | ICD-10-CM | POA: Diagnosis not present

## 2015-03-23 DIAGNOSIS — I4891 Unspecified atrial fibrillation: Secondary | ICD-10-CM | POA: Diagnosis not present

## 2015-03-23 DIAGNOSIS — E1165 Type 2 diabetes mellitus with hyperglycemia: Secondary | ICD-10-CM | POA: Diagnosis not present

## 2015-03-23 DIAGNOSIS — I10 Essential (primary) hypertension: Secondary | ICD-10-CM | POA: Diagnosis not present

## 2015-03-23 DIAGNOSIS — G8194 Hemiplegia, unspecified affecting left nondominant side: Secondary | ICD-10-CM | POA: Diagnosis not present

## 2015-03-23 DIAGNOSIS — K5792 Diverticulitis of intestine, part unspecified, without perforation or abscess without bleeding: Secondary | ICD-10-CM | POA: Diagnosis not present

## 2015-03-25 ENCOUNTER — Ambulatory Visit: Payer: Medicare Other | Admitting: Podiatry

## 2015-03-25 DIAGNOSIS — E041 Nontoxic single thyroid nodule: Secondary | ICD-10-CM | POA: Diagnosis present

## 2015-03-25 DIAGNOSIS — E860 Dehydration: Secondary | ICD-10-CM | POA: Diagnosis not present

## 2015-03-25 DIAGNOSIS — E039 Hypothyroidism, unspecified: Secondary | ICD-10-CM | POA: Diagnosis present

## 2015-03-25 DIAGNOSIS — K921 Melena: Secondary | ICD-10-CM | POA: Diagnosis present

## 2015-03-25 DIAGNOSIS — I4891 Unspecified atrial fibrillation: Secondary | ICD-10-CM | POA: Diagnosis present

## 2015-03-25 DIAGNOSIS — Z7982 Long term (current) use of aspirin: Secondary | ICD-10-CM | POA: Diagnosis not present

## 2015-03-25 DIAGNOSIS — D649 Anemia, unspecified: Secondary | ICD-10-CM | POA: Diagnosis not present

## 2015-03-25 DIAGNOSIS — I2510 Atherosclerotic heart disease of native coronary artery without angina pectoris: Secondary | ICD-10-CM | POA: Diagnosis present

## 2015-03-25 DIAGNOSIS — K5732 Diverticulitis of large intestine without perforation or abscess without bleeding: Secondary | ICD-10-CM | POA: Diagnosis not present

## 2015-03-25 DIAGNOSIS — R109 Unspecified abdominal pain: Secondary | ICD-10-CM | POA: Diagnosis not present

## 2015-03-25 DIAGNOSIS — Z8572 Personal history of non-Hodgkin lymphomas: Secondary | ICD-10-CM | POA: Diagnosis not present

## 2015-03-25 DIAGNOSIS — A419 Sepsis, unspecified organism: Secondary | ICD-10-CM | POA: Diagnosis not present

## 2015-03-25 DIAGNOSIS — R29810 Facial weakness: Secondary | ICD-10-CM | POA: Diagnosis not present

## 2015-03-25 DIAGNOSIS — E780 Pure hypercholesterolemia: Secondary | ICD-10-CM | POA: Diagnosis present

## 2015-03-25 DIAGNOSIS — I252 Old myocardial infarction: Secondary | ICD-10-CM | POA: Diagnosis not present

## 2015-03-25 DIAGNOSIS — Z79899 Other long term (current) drug therapy: Secondary | ICD-10-CM | POA: Diagnosis not present

## 2015-03-25 DIAGNOSIS — I6523 Occlusion and stenosis of bilateral carotid arteries: Secondary | ICD-10-CM | POA: Diagnosis not present

## 2015-03-25 DIAGNOSIS — K219 Gastro-esophageal reflux disease without esophagitis: Secondary | ICD-10-CM | POA: Diagnosis present

## 2015-03-25 DIAGNOSIS — K922 Gastrointestinal hemorrhage, unspecified: Secondary | ICD-10-CM | POA: Diagnosis not present

## 2015-03-25 DIAGNOSIS — M1990 Unspecified osteoarthritis, unspecified site: Secondary | ICD-10-CM | POA: Diagnosis present

## 2015-03-25 DIAGNOSIS — I10 Essential (primary) hypertension: Secondary | ICD-10-CM | POA: Diagnosis present

## 2015-03-25 DIAGNOSIS — K5792 Diverticulitis of intestine, part unspecified, without perforation or abscess without bleeding: Secondary | ICD-10-CM | POA: Diagnosis not present

## 2015-03-25 DIAGNOSIS — Z8673 Personal history of transient ischemic attack (TIA), and cerebral infarction without residual deficits: Secondary | ICD-10-CM | POA: Diagnosis not present

## 2015-03-25 DIAGNOSIS — Z888 Allergy status to other drugs, medicaments and biological substances status: Secondary | ICD-10-CM | POA: Diagnosis not present

## 2015-03-25 DIAGNOSIS — K529 Noninfective gastroenteritis and colitis, unspecified: Secondary | ICD-10-CM | POA: Diagnosis not present

## 2015-03-25 DIAGNOSIS — R531 Weakness: Secondary | ICD-10-CM | POA: Diagnosis not present

## 2015-03-25 DIAGNOSIS — D62 Acute posthemorrhagic anemia: Secondary | ICD-10-CM | POA: Diagnosis not present

## 2015-03-25 DIAGNOSIS — E1165 Type 2 diabetes mellitus with hyperglycemia: Secondary | ICD-10-CM | POA: Diagnosis not present

## 2015-03-25 DIAGNOSIS — E871 Hypo-osmolality and hyponatremia: Secondary | ICD-10-CM | POA: Diagnosis not present

## 2015-04-03 DIAGNOSIS — K5732 Diverticulitis of large intestine without perforation or abscess without bleeding: Secondary | ICD-10-CM | POA: Diagnosis not present

## 2015-04-03 DIAGNOSIS — G458 Other transient cerebral ischemic attacks and related syndromes: Secondary | ICD-10-CM | POA: Diagnosis not present

## 2015-04-22 DIAGNOSIS — K5730 Diverticulosis of large intestine without perforation or abscess without bleeding: Secondary | ICD-10-CM | POA: Diagnosis not present

## 2015-04-23 DIAGNOSIS — Z86010 Personal history of colonic polyps: Secondary | ICD-10-CM | POA: Diagnosis not present

## 2015-04-23 DIAGNOSIS — D500 Iron deficiency anemia secondary to blood loss (chronic): Secondary | ICD-10-CM | POA: Diagnosis not present

## 2015-04-27 DIAGNOSIS — E1165 Type 2 diabetes mellitus with hyperglycemia: Secondary | ICD-10-CM | POA: Diagnosis not present

## 2015-04-27 DIAGNOSIS — E871 Hypo-osmolality and hyponatremia: Secondary | ICD-10-CM | POA: Diagnosis not present

## 2015-04-27 DIAGNOSIS — K5733 Diverticulitis of large intestine without perforation or abscess with bleeding: Secondary | ICD-10-CM | POA: Diagnosis not present

## 2015-04-28 DIAGNOSIS — G459 Transient cerebral ischemic attack, unspecified: Secondary | ICD-10-CM | POA: Diagnosis not present

## 2015-05-19 DIAGNOSIS — D649 Anemia, unspecified: Secondary | ICD-10-CM | POA: Diagnosis not present

## 2015-05-21 DIAGNOSIS — Z23 Encounter for immunization: Secondary | ICD-10-CM | POA: Diagnosis not present

## 2015-05-28 DIAGNOSIS — G459 Transient cerebral ischemic attack, unspecified: Secondary | ICD-10-CM | POA: Diagnosis not present

## 2015-05-28 DIAGNOSIS — Z681 Body mass index (BMI) 19 or less, adult: Secondary | ICD-10-CM | POA: Diagnosis not present

## 2015-06-01 ENCOUNTER — Encounter: Payer: Self-pay | Admitting: Internal Medicine

## 2015-06-01 ENCOUNTER — Ambulatory Visit (INDEPENDENT_AMBULATORY_CARE_PROVIDER_SITE_OTHER): Payer: Medicare Other | Admitting: Internal Medicine

## 2015-06-01 ENCOUNTER — Other Ambulatory Visit: Payer: Self-pay

## 2015-06-01 VITALS — BP 126/84 | HR 94 | Ht 64.0 in | Wt 109.2 lb

## 2015-06-01 DIAGNOSIS — I471 Supraventricular tachycardia: Secondary | ICD-10-CM | POA: Diagnosis not present

## 2015-06-01 DIAGNOSIS — G458 Other transient cerebral ischemic attacks and related syndromes: Secondary | ICD-10-CM | POA: Diagnosis not present

## 2015-06-01 DIAGNOSIS — R072 Precordial pain: Secondary | ICD-10-CM

## 2015-06-01 NOTE — Patient Instructions (Signed)
Medication Instructions:  Your physician recommends that you continue on your current medications as directed. Please refer to the Current Medication list given to you today.   Labwork: None ordered   Testing/Procedures: Your physician has requested that you have an echocardiogram. Echocardiography is a painless test that uses sound waves to create images of your heart. It provides your doctor with information about the size and shape of your heart and how well your heart's chambers and valves are working. This procedure takes approximately one hour. There are no restrictions for this procedure.  Your physician has recommended that you wear an event monitor. Event monitors are medical devices that record the heart's electrical activity. Doctors most often [place] these monitors to diagnose arrhythmias. Arrhythmias are problems with the speed or rhythm of the heartbeat. The monitor is a small, portable device. You can wear one while you do your normal daily activities. This is usually used to diagnose what is causing palpitations/syncope (passing out).    Follow-Up:  Your physician wants you to follow-up in: 12 months with Dr Vallery Ridge will receive a reminder letter in the mail two months in advance. If you don't receive a letter, please call our office to schedule the follow-up appointment.     Any Other Special Instructions Will Be Listed Below (If Applicable).

## 2015-06-02 DIAGNOSIS — G459 Transient cerebral ischemic attack, unspecified: Secondary | ICD-10-CM | POA: Insufficient documentation

## 2015-06-02 DIAGNOSIS — I6782 Cerebral ischemia: Secondary | ICD-10-CM | POA: Insufficient documentation

## 2015-06-02 DIAGNOSIS — G939 Disorder of brain, unspecified: Secondary | ICD-10-CM | POA: Insufficient documentation
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
## 2015-06-10 DIAGNOSIS — R531 Weakness: Secondary | ICD-10-CM | POA: Diagnosis not present

## 2015-06-10 DIAGNOSIS — S199XXA Unspecified injury of neck, initial encounter: Secondary | ICD-10-CM | POA: Diagnosis not present

## 2015-06-10 DIAGNOSIS — R51 Headache: Secondary | ICD-10-CM | POA: Diagnosis not present

## 2015-06-10 DIAGNOSIS — K5732 Diverticulitis of large intestine without perforation or abscess without bleeding: Secondary | ICD-10-CM | POA: Diagnosis not present

## 2015-06-10 DIAGNOSIS — S0101XA Laceration without foreign body of scalp, initial encounter: Secondary | ICD-10-CM | POA: Diagnosis not present

## 2015-06-10 DIAGNOSIS — S0990XA Unspecified injury of head, initial encounter: Secondary | ICD-10-CM | POA: Diagnosis not present

## 2015-06-11 ENCOUNTER — Telehealth: Payer: Self-pay | Admitting: Internal Medicine

## 2015-06-11 ENCOUNTER — Other Ambulatory Visit: Payer: Self-pay | Admitting: Internal Medicine

## 2015-06-11 DIAGNOSIS — I471 Supraventricular tachycardia, unspecified: Secondary | ICD-10-CM

## 2015-06-11 DIAGNOSIS — I4891 Unspecified atrial fibrillation: Secondary | ICD-10-CM

## 2015-06-11 DIAGNOSIS — G458 Other transient cerebral ischemic attacks and related syndromes: Secondary | ICD-10-CM

## 2015-06-11 DIAGNOSIS — R002 Palpitations: Secondary | ICD-10-CM

## 2015-06-11 NOTE — Telephone Encounter (Signed)
Returned daughters call.  Her reason for calling was to let us know her mom tripped and fell yesterday over another persons walker.  She had to get stiches and is extremely sore.  She is due to have echo and monitor Monday but feel the monitor may be too much at present and may have to reschedule.  She will let us know when she gets here if she needs to reschedule the monitor portion

## 2015-06-11 NOTE — Telephone Encounter (Signed)
F/u  Pt dtr returning [place] phone call. Please call back and discuss.

## 2015-06-11 NOTE — Telephone Encounter (Signed)
New Message  Pt daughter calling she wants RN to call her back to   Se if her mother needs the [place]  Her mother had a recent fall and she wants to talk to RN

## 2015-06-11 NOTE — Telephone Encounter (Signed)
2. TIAs Obtain an echo Reviewed records from [facility] does not include an echo 30 day event monitor to evaluate for afib as the cause  I have left a message for karen with the above information and to call back with further questions

## 2015-06-15 ENCOUNTER — Other Ambulatory Visit: Payer: Self-pay

## 2015-06-15 ENCOUNTER — Ambulatory Visit (HOSPITAL_COMMUNITY): Payer: Medicare Other | Attending: Cardiovascular Disease

## 2015-06-15 DIAGNOSIS — E785 Hyperlipidemia, unspecified: Secondary | ICD-10-CM | POA: Insufficient documentation

## 2015-06-15 DIAGNOSIS — I517 Cardiomegaly: Secondary | ICD-10-CM | POA: Insufficient documentation

## 2015-06-15 DIAGNOSIS — G458 Other transient cerebral ischemic attacks and related syndromes: Secondary | ICD-10-CM | POA: Diagnosis not present

## 2015-06-15 DIAGNOSIS — G459 Transient cerebral ischemic attack, unspecified: Secondary | ICD-10-CM | POA: Insufficient documentation

## 2015-06-15 DIAGNOSIS — I10 Essential (primary) hypertension: Secondary | ICD-10-CM | POA: Insufficient documentation

## 2015-06-15 DIAGNOSIS — I313 Pericardial effusion (noninflammatory): Secondary | ICD-10-CM | POA: Insufficient documentation

## 2015-06-15 DIAGNOSIS — I340 Nonrheumatic mitral (valve) insufficiency: Secondary | ICD-10-CM | POA: Diagnosis not present

## 2015-06-15 DIAGNOSIS — I351 Nonrheumatic aortic (valve) insufficiency: Secondary | ICD-10-CM | POA: Diagnosis not present

## 2015-06-15 DIAGNOSIS — I071 Rheumatic tricuspid insufficiency: Secondary | ICD-10-CM | POA: Diagnosis not present

## 2015-06-28 DIAGNOSIS — K5733 Diverticulitis of large intestine without perforation or abscess with bleeding: Secondary | ICD-10-CM | POA: Diagnosis not present

## 2015-06-29 ENCOUNTER — Telehealth: Payer: Self-pay | Admitting: Internal Medicine

## 2015-06-29 DIAGNOSIS — K5732 Diverticulitis of large intestine without perforation or abscess without bleeding: Secondary | ICD-10-CM | POA: Diagnosis not present

## 2015-06-29 DIAGNOSIS — R200 Anesthesia of skin: Secondary | ICD-10-CM | POA: Diagnosis not present

## 2015-06-29 DIAGNOSIS — K5792 Diverticulitis of intestine, part unspecified, without perforation or abscess without bleeding: Secondary | ICD-10-CM | POA: Diagnosis not present

## 2015-06-29 DIAGNOSIS — D649 Anemia, unspecified: Secondary | ICD-10-CM | POA: Diagnosis not present

## 2015-06-29 DIAGNOSIS — I6789 Other cerebrovascular disease: Secondary | ICD-10-CM | POA: Diagnosis not present

## 2015-06-29 NOTE — Telephone Encounter (Signed)
°  Pt c/o BP issue: STAT if pt c/o blurred vision, one-sided weakness or slurred speech  1. What are your last 5 BP readings? 102/54 (this morning), 95/61 (at 2:15 PM today)  2. Are you having any other symptoms (ex. Dizziness, headache, blurred vision, passed out)? No  3. What is your BP issue? Pt was seen by GI. GI doctor-Dr. Dominga Ferry in [place] recommending possible dosage/direction change for Metoprolol as blood pressure has been low.   Please call.

## 2015-06-29 NOTE — Telephone Encounter (Addendum)
?   Decrease Metoprolol 12.5mg  twice daily.  Will forward to Dr [name] for recommendations as she is not having any symptoms but has had a fall recently.  Per daughter she tripped over another persons walker

## 2015-07-07 NOTE — Telephone Encounter (Signed)
Called and left a message for patient's daughter that Dr [name] would prefer her to continue her dose of Metoprolol at 25mg  bid unless she becomes symptomatic.  If she does then we can discuss decreasing dose.  I have asked she call back with any questions

## 2015-07-07 NOTE — Telephone Encounter (Signed)
Please follow-up with patient  Would prefer to keep at usual dose of metoprolol if BP has improved.

## 2015-07-13 DIAGNOSIS — D62 Acute posthemorrhagic anemia: Secondary | ICD-10-CM | POA: Diagnosis not present

## 2015-07-13 DIAGNOSIS — Z48815 Encounter for surgical aftercare following surgery on the digestive system: Secondary | ICD-10-CM | POA: Diagnosis not present

## 2015-07-13 DIAGNOSIS — C189 Malignant neoplasm of colon, unspecified: Secondary | ICD-10-CM | POA: Diagnosis present

## 2015-07-13 DIAGNOSIS — R634 Abnormal weight loss: Secondary | ICD-10-CM | POA: Diagnosis present

## 2015-07-13 DIAGNOSIS — R1909 Other intra-abdominal and pelvic swelling, mass and lump: Secondary | ICD-10-CM | POA: Diagnosis not present

## 2015-07-13 DIAGNOSIS — Z9181 History of falling: Secondary | ICD-10-CM | POA: Diagnosis not present

## 2015-07-13 DIAGNOSIS — I10 Essential (primary) hypertension: Secondary | ICD-10-CM | POA: Diagnosis not present

## 2015-07-13 DIAGNOSIS — C884 Extranodal marginal zone B-cell lymphoma of mucosa-associated lymphoid tissue [MALT-lymphoma]: Secondary | ICD-10-CM | POA: Diagnosis not present

## 2015-07-13 DIAGNOSIS — I2510 Atherosclerotic heart disease of native coronary artery without angina pectoris: Secondary | ICD-10-CM | POA: Diagnosis not present

## 2015-07-13 DIAGNOSIS — K639 Disease of intestine, unspecified: Secondary | ICD-10-CM | POA: Diagnosis not present

## 2015-07-13 DIAGNOSIS — Z681 Body mass index (BMI) 19 or less, adult: Secondary | ICD-10-CM | POA: Diagnosis not present

## 2015-07-13 DIAGNOSIS — R1032 Left lower quadrant pain: Secondary | ICD-10-CM | POA: Diagnosis not present

## 2015-07-13 DIAGNOSIS — J069 Acute upper respiratory infection, unspecified: Secondary | ICD-10-CM | POA: Diagnosis not present

## 2015-07-13 DIAGNOSIS — Z8673 Personal history of transient ischemic attack (TIA), and cerebral infarction without residual deficits: Secondary | ICD-10-CM | POA: Diagnosis not present

## 2015-07-13 DIAGNOSIS — K630 Abscess of intestine: Secondary | ICD-10-CM | POA: Diagnosis not present

## 2015-07-13 DIAGNOSIS — E892 Postprocedural hypoparathyroidism: Secondary | ICD-10-CM | POA: Diagnosis present

## 2015-07-13 DIAGNOSIS — M1990 Unspecified osteoarthritis, unspecified site: Secondary | ICD-10-CM | POA: Diagnosis present

## 2015-07-13 DIAGNOSIS — K5732 Diverticulitis of large intestine without perforation or abscess without bleeding: Secondary | ICD-10-CM | POA: Diagnosis not present

## 2015-07-13 DIAGNOSIS — D374 Neoplasm of uncertain behavior of colon: Secondary | ICD-10-CM | POA: Diagnosis not present

## 2015-07-13 DIAGNOSIS — B9789 Other viral agents as the cause of diseases classified elsewhere: Secondary | ICD-10-CM | POA: Diagnosis not present

## 2015-07-13 DIAGNOSIS — Z8711 Personal history of peptic ulcer disease: Secondary | ICD-10-CM | POA: Diagnosis not present

## 2015-07-13 DIAGNOSIS — Z8579 Personal history of other malignant neoplasms of lymphoid, hematopoietic and related tissues: Secondary | ICD-10-CM | POA: Diagnosis not present

## 2015-07-13 DIAGNOSIS — Z91041 Radiographic dye allergy status: Secondary | ICD-10-CM | POA: Diagnosis not present

## 2015-07-13 DIAGNOSIS — K922 Gastrointestinal hemorrhage, unspecified: Secondary | ICD-10-CM | POA: Diagnosis not present

## 2015-07-13 DIAGNOSIS — Z86010 Personal history of colonic polyps: Secondary | ICD-10-CM | POA: Diagnosis not present

## 2015-07-13 DIAGNOSIS — E039 Hypothyroidism, unspecified: Secondary | ICD-10-CM | POA: Diagnosis present

## 2015-07-13 DIAGNOSIS — E1165 Type 2 diabetes mellitus with hyperglycemia: Secondary | ICD-10-CM | POA: Diagnosis present

## 2015-07-13 DIAGNOSIS — C186 Malignant neoplasm of descending colon: Secondary | ICD-10-CM | POA: Diagnosis not present

## 2015-07-13 DIAGNOSIS — D649 Anemia, unspecified: Secondary | ICD-10-CM | POA: Diagnosis not present

## 2015-07-13 DIAGNOSIS — K5792 Diverticulitis of intestine, part unspecified, without perforation or abscess without bleeding: Secondary | ICD-10-CM | POA: Diagnosis not present

## 2015-07-13 DIAGNOSIS — E611 Iron deficiency: Secondary | ICD-10-CM | POA: Diagnosis not present

## 2015-07-13 DIAGNOSIS — I361 Nonrheumatic tricuspid (valve) insufficiency: Secondary | ICD-10-CM | POA: Diagnosis not present

## 2015-07-13 DIAGNOSIS — Z886 Allergy status to analgesic agent status: Secondary | ICD-10-CM | POA: Diagnosis not present

## 2015-07-13 DIAGNOSIS — Z888 Allergy status to other drugs, medicaments and biological substances status: Secondary | ICD-10-CM | POA: Diagnosis not present

## 2015-07-13 DIAGNOSIS — Z794 Long term (current) use of insulin: Secondary | ICD-10-CM | POA: Diagnosis not present

## 2015-07-13 DIAGNOSIS — Z01810 Encounter for preprocedural cardiovascular examination: Secondary | ICD-10-CM | POA: Diagnosis not present

## 2015-07-13 DIAGNOSIS — I252 Old myocardial infarction: Secondary | ICD-10-CM | POA: Diagnosis not present

## 2015-07-24 DIAGNOSIS — R262 Difficulty in walking, not elsewhere classified: Secondary | ICD-10-CM | POA: Diagnosis not present

## 2015-07-24 DIAGNOSIS — D649 Anemia, unspecified: Secondary | ICD-10-CM | POA: Diagnosis not present

## 2015-07-24 DIAGNOSIS — K5792 Diverticulitis of intestine, part unspecified, without perforation or abscess without bleeding: Secondary | ICD-10-CM | POA: Diagnosis not present

## 2015-07-24 DIAGNOSIS — K639 Disease of intestine, unspecified: Secondary | ICD-10-CM | POA: Diagnosis not present

## 2015-07-24 DIAGNOSIS — R627 Adult failure to thrive: Secondary | ICD-10-CM | POA: Diagnosis not present

## 2015-07-24 DIAGNOSIS — Z48815 Encounter for surgical aftercare following surgery on the digestive system: Secondary | ICD-10-CM | POA: Diagnosis not present

## 2015-07-24 DIAGNOSIS — E1165 Type 2 diabetes mellitus with hyperglycemia: Secondary | ICD-10-CM | POA: Diagnosis not present

## 2015-07-24 DIAGNOSIS — D62 Acute posthemorrhagic anemia: Secondary | ICD-10-CM | POA: Diagnosis not present

## 2015-07-24 DIAGNOSIS — I10 Essential (primary) hypertension: Secondary | ICD-10-CM | POA: Diagnosis not present

## 2015-07-24 DIAGNOSIS — C884 Extranodal marginal zone B-cell lymphoma of mucosa-associated lymphoid tissue [MALT-lymphoma]: Secondary | ICD-10-CM | POA: Diagnosis not present

## 2015-07-27 DIAGNOSIS — R262 Difficulty in walking, not elsewhere classified: Secondary | ICD-10-CM | POA: Diagnosis not present

## 2015-07-27 DIAGNOSIS — R627 Adult failure to thrive: Secondary | ICD-10-CM | POA: Diagnosis not present

## 2015-07-27 DIAGNOSIS — K639 Disease of intestine, unspecified: Secondary | ICD-10-CM | POA: Diagnosis not present

## 2015-07-27 DIAGNOSIS — D649 Anemia, unspecified: Secondary | ICD-10-CM | POA: Diagnosis not present

## 2015-08-12 DIAGNOSIS — E1165 Type 2 diabetes mellitus with hyperglycemia: Secondary | ICD-10-CM | POA: Diagnosis not present

## 2015-08-12 DIAGNOSIS — Z9181 History of falling: Secondary | ICD-10-CM | POA: Diagnosis not present

## 2015-08-12 DIAGNOSIS — R269 Unspecified abnormalities of gait and mobility: Secondary | ICD-10-CM | POA: Diagnosis not present

## 2015-08-12 DIAGNOSIS — K5781 Diverticulitis of intestine, part unspecified, with perforation and abscess with bleeding: Secondary | ICD-10-CM | POA: Diagnosis not present

## 2015-08-12 DIAGNOSIS — I2510 Atherosclerotic heart disease of native coronary artery without angina pectoris: Secondary | ICD-10-CM | POA: Diagnosis not present

## 2015-08-12 DIAGNOSIS — C189 Malignant neoplasm of colon, unspecified: Secondary | ICD-10-CM | POA: Diagnosis not present

## 2015-08-12 DIAGNOSIS — Z8673 Personal history of transient ischemic attack (TIA), and cerebral infarction without residual deficits: Secondary | ICD-10-CM | POA: Diagnosis not present

## 2015-08-12 DIAGNOSIS — D62 Acute posthemorrhagic anemia: Secondary | ICD-10-CM | POA: Diagnosis not present

## 2015-08-12 DIAGNOSIS — I10 Essential (primary) hypertension: Secondary | ICD-10-CM | POA: Diagnosis not present

## 2015-08-12 DIAGNOSIS — D649 Anemia, unspecified: Secondary | ICD-10-CM | POA: Diagnosis not present

## 2015-08-12 DIAGNOSIS — E871 Hypo-osmolality and hyponatremia: Secondary | ICD-10-CM | POA: Diagnosis not present

## 2015-08-12 DIAGNOSIS — Z602 Problems related to living alone: Secondary | ICD-10-CM | POA: Diagnosis not present

## 2015-08-13 DIAGNOSIS — K5732 Diverticulitis of large intestine without perforation or abscess without bleeding: Secondary | ICD-10-CM | POA: Diagnosis not present

## 2015-08-13 DIAGNOSIS — D62 Acute posthemorrhagic anemia: Secondary | ICD-10-CM | POA: Diagnosis not present

## 2015-08-13 DIAGNOSIS — K5781 Diverticulitis of intestine, part unspecified, with perforation and abscess with bleeding: Secondary | ICD-10-CM | POA: Diagnosis not present

## 2015-08-13 DIAGNOSIS — E1165 Type 2 diabetes mellitus with hyperglycemia: Secondary | ICD-10-CM | POA: Diagnosis not present

## 2015-08-13 DIAGNOSIS — I2510 Atherosclerotic heart disease of native coronary artery without angina pectoris: Secondary | ICD-10-CM | POA: Diagnosis not present

## 2015-08-13 DIAGNOSIS — R269 Unspecified abnormalities of gait and mobility: Secondary | ICD-10-CM | POA: Diagnosis not present

## 2015-08-17 DIAGNOSIS — R269 Unspecified abnormalities of gait and mobility: Secondary | ICD-10-CM | POA: Diagnosis not present

## 2015-08-17 DIAGNOSIS — E1165 Type 2 diabetes mellitus with hyperglycemia: Secondary | ICD-10-CM | POA: Diagnosis not present

## 2015-08-17 DIAGNOSIS — K5781 Diverticulitis of intestine, part unspecified, with perforation and abscess with bleeding: Secondary | ICD-10-CM | POA: Diagnosis not present

## 2015-08-17 DIAGNOSIS — D62 Acute posthemorrhagic anemia: Secondary | ICD-10-CM | POA: Diagnosis not present

## 2015-08-17 DIAGNOSIS — I2510 Atherosclerotic heart disease of native coronary artery without angina pectoris: Secondary | ICD-10-CM | POA: Diagnosis not present

## 2015-08-19 DIAGNOSIS — I2510 Atherosclerotic heart disease of native coronary artery without angina pectoris: Secondary | ICD-10-CM | POA: Diagnosis not present

## 2015-08-19 DIAGNOSIS — D62 Acute posthemorrhagic anemia: Secondary | ICD-10-CM | POA: Diagnosis not present

## 2015-08-19 DIAGNOSIS — R269 Unspecified abnormalities of gait and mobility: Secondary | ICD-10-CM | POA: Diagnosis not present

## 2015-08-19 DIAGNOSIS — E1165 Type 2 diabetes mellitus with hyperglycemia: Secondary | ICD-10-CM | POA: Diagnosis not present

## 2015-08-19 DIAGNOSIS — K5781 Diverticulitis of intestine, part unspecified, with perforation and abscess with bleeding: Secondary | ICD-10-CM | POA: Diagnosis not present

## 2015-08-21 DIAGNOSIS — R269 Unspecified abnormalities of gait and mobility: Secondary | ICD-10-CM | POA: Diagnosis not present

## 2015-08-21 DIAGNOSIS — K5781 Diverticulitis of intestine, part unspecified, with perforation and abscess with bleeding: Secondary | ICD-10-CM | POA: Diagnosis not present

## 2015-08-21 DIAGNOSIS — E1165 Type 2 diabetes mellitus with hyperglycemia: Secondary | ICD-10-CM | POA: Diagnosis not present

## 2015-08-21 DIAGNOSIS — D62 Acute posthemorrhagic anemia: Secondary | ICD-10-CM | POA: Diagnosis not present

## 2015-08-21 DIAGNOSIS — I2510 Atherosclerotic heart disease of native coronary artery without angina pectoris: Secondary | ICD-10-CM | POA: Diagnosis not present

## 2015-08-25 DIAGNOSIS — K5781 Diverticulitis of intestine, part unspecified, with perforation and abscess with bleeding: Secondary | ICD-10-CM | POA: Diagnosis not present

## 2015-08-25 DIAGNOSIS — D62 Acute posthemorrhagic anemia: Secondary | ICD-10-CM | POA: Diagnosis not present

## 2015-08-25 DIAGNOSIS — E1165 Type 2 diabetes mellitus with hyperglycemia: Secondary | ICD-10-CM | POA: Diagnosis not present

## 2015-08-25 DIAGNOSIS — I2510 Atherosclerotic heart disease of native coronary artery without angina pectoris: Secondary | ICD-10-CM | POA: Diagnosis not present

## 2015-08-25 DIAGNOSIS — R269 Unspecified abnormalities of gait and mobility: Secondary | ICD-10-CM | POA: Diagnosis not present

## 2015-08-26 DIAGNOSIS — D62 Acute posthemorrhagic anemia: Secondary | ICD-10-CM | POA: Diagnosis not present

## 2015-08-26 DIAGNOSIS — K5781 Diverticulitis of intestine, part unspecified, with perforation and abscess with bleeding: Secondary | ICD-10-CM | POA: Diagnosis not present

## 2015-08-26 DIAGNOSIS — E1165 Type 2 diabetes mellitus with hyperglycemia: Secondary | ICD-10-CM | POA: Diagnosis not present

## 2015-08-26 DIAGNOSIS — R269 Unspecified abnormalities of gait and mobility: Secondary | ICD-10-CM | POA: Diagnosis not present

## 2015-08-26 DIAGNOSIS — I2510 Atherosclerotic heart disease of native coronary artery without angina pectoris: Secondary | ICD-10-CM | POA: Diagnosis not present

## 2015-08-27 DIAGNOSIS — Z681 Body mass index (BMI) 19 or less, adult: Secondary | ICD-10-CM | POA: Diagnosis not present

## 2015-08-27 DIAGNOSIS — G459 Transient cerebral ischemic attack, unspecified: Secondary | ICD-10-CM | POA: Diagnosis not present

## 2015-08-27 DIAGNOSIS — G939 Disorder of brain, unspecified: Secondary | ICD-10-CM | POA: Diagnosis not present

## 2015-08-28 DIAGNOSIS — I2510 Atherosclerotic heart disease of native coronary artery without angina pectoris: Secondary | ICD-10-CM | POA: Diagnosis not present

## 2015-08-28 DIAGNOSIS — D62 Acute posthemorrhagic anemia: Secondary | ICD-10-CM | POA: Diagnosis not present

## 2015-08-28 DIAGNOSIS — R269 Unspecified abnormalities of gait and mobility: Secondary | ICD-10-CM | POA: Diagnosis not present

## 2015-08-28 DIAGNOSIS — E1165 Type 2 diabetes mellitus with hyperglycemia: Secondary | ICD-10-CM | POA: Diagnosis not present

## 2015-08-28 DIAGNOSIS — K5781 Diverticulitis of intestine, part unspecified, with perforation and abscess with bleeding: Secondary | ICD-10-CM | POA: Diagnosis not present

## 2015-09-01 DIAGNOSIS — K5781 Diverticulitis of intestine, part unspecified, with perforation and abscess with bleeding: Secondary | ICD-10-CM | POA: Diagnosis not present

## 2015-09-01 DIAGNOSIS — E1165 Type 2 diabetes mellitus with hyperglycemia: Secondary | ICD-10-CM | POA: Diagnosis not present

## 2015-09-01 DIAGNOSIS — R269 Unspecified abnormalities of gait and mobility: Secondary | ICD-10-CM | POA: Diagnosis not present

## 2015-09-01 DIAGNOSIS — D62 Acute posthemorrhagic anemia: Secondary | ICD-10-CM | POA: Diagnosis not present

## 2015-09-01 DIAGNOSIS — I2510 Atherosclerotic heart disease of native coronary artery without angina pectoris: Secondary | ICD-10-CM | POA: Diagnosis not present

## 2015-09-02 DIAGNOSIS — I2510 Atherosclerotic heart disease of native coronary artery without angina pectoris: Secondary | ICD-10-CM | POA: Diagnosis not present

## 2015-09-02 DIAGNOSIS — R269 Unspecified abnormalities of gait and mobility: Secondary | ICD-10-CM | POA: Diagnosis not present

## 2015-09-02 DIAGNOSIS — K5781 Diverticulitis of intestine, part unspecified, with perforation and abscess with bleeding: Secondary | ICD-10-CM | POA: Diagnosis not present

## 2015-09-02 DIAGNOSIS — E1165 Type 2 diabetes mellitus with hyperglycemia: Secondary | ICD-10-CM | POA: Diagnosis not present

## 2015-09-02 DIAGNOSIS — D62 Acute posthemorrhagic anemia: Secondary | ICD-10-CM | POA: Diagnosis not present

## 2015-09-09 DIAGNOSIS — K5900 Constipation, unspecified: Secondary | ICD-10-CM | POA: Diagnosis not present

## 2015-09-09 DIAGNOSIS — K649 Unspecified hemorrhoids: Secondary | ICD-10-CM | POA: Diagnosis not present

## 2015-10-22 DIAGNOSIS — Z79899 Other long term (current) drug therapy: Secondary | ICD-10-CM | POA: Diagnosis not present

## 2015-10-22 DIAGNOSIS — Z85038 Personal history of other malignant neoplasm of large intestine: Secondary | ICD-10-CM | POA: Diagnosis not present

## 2015-10-22 DIAGNOSIS — I679 Cerebrovascular disease, unspecified: Secondary | ICD-10-CM | POA: Diagnosis not present

## 2015-10-22 DIAGNOSIS — Z0000 Encounter for general adult medical examination without abnormal findings: Secondary | ICD-10-CM | POA: Diagnosis not present

## 2015-10-22 DIAGNOSIS — E1165 Type 2 diabetes mellitus with hyperglycemia: Secondary | ICD-10-CM | POA: Diagnosis not present

## 2015-10-22 DIAGNOSIS — R809 Proteinuria, unspecified: Secondary | ICD-10-CM | POA: Diagnosis not present

## 2015-10-22 DIAGNOSIS — E1129 Type 2 diabetes mellitus with other diabetic kidney complication: Secondary | ICD-10-CM | POA: Diagnosis not present

## 2016-02-25 DIAGNOSIS — G459 Transient cerebral ischemic attack, unspecified: Secondary | ICD-10-CM | POA: Diagnosis not present

## 2016-03-30 DIAGNOSIS — H40003 Preglaucoma, unspecified, bilateral: Secondary | ICD-10-CM | POA: Diagnosis not present

## 2016-04-14 DIAGNOSIS — E1169 Type 2 diabetes mellitus with other specified complication: Secondary | ICD-10-CM | POA: Diagnosis not present

## 2016-04-14 DIAGNOSIS — E782 Mixed hyperlipidemia: Secondary | ICD-10-CM | POA: Diagnosis not present

## 2016-04-14 DIAGNOSIS — D539 Nutritional anemia, unspecified: Secondary | ICD-10-CM | POA: Diagnosis not present

## 2016-04-21 DIAGNOSIS — Z1389 Encounter for screening for other disorder: Secondary | ICD-10-CM | POA: Diagnosis not present

## 2016-04-21 DIAGNOSIS — Z0000 Encounter for general adult medical examination without abnormal findings: Secondary | ICD-10-CM | POA: Diagnosis not present

## 2016-04-21 DIAGNOSIS — E1165 Type 2 diabetes mellitus with hyperglycemia: Secondary | ICD-10-CM | POA: Diagnosis not present

## 2016-04-21 DIAGNOSIS — Z23 Encounter for immunization: Secondary | ICD-10-CM | POA: Diagnosis not present

## 2016-04-21 DIAGNOSIS — E1129 Type 2 diabetes mellitus with other diabetic kidney complication: Secondary | ICD-10-CM | POA: Diagnosis not present

## 2016-04-21 DIAGNOSIS — R809 Proteinuria, unspecified: Secondary | ICD-10-CM | POA: Diagnosis not present

## 2016-04-27 ENCOUNTER — Other Ambulatory Visit: Payer: Self-pay

## 2016-05-18 ENCOUNTER — Encounter: Payer: Self-pay | Admitting: Internal Medicine

## 2016-05-20 ENCOUNTER — Encounter: Payer: Self-pay | Admitting: Internal Medicine

## 2016-06-01 ENCOUNTER — Encounter: Payer: Self-pay | Admitting: Internal Medicine

## 2016-06-01 ENCOUNTER — Encounter (INDEPENDENT_AMBULATORY_CARE_PROVIDER_SITE_OTHER): Payer: Self-pay

## 2016-06-01 ENCOUNTER — Ambulatory Visit (INDEPENDENT_AMBULATORY_CARE_PROVIDER_SITE_OTHER): Payer: Medicare Other | Admitting: Internal Medicine

## 2016-06-01 ENCOUNTER — Ambulatory Visit: Payer: Medicare Other | Admitting: Internal Medicine

## 2016-06-01 VITALS — BP 118/78 | HR 99 | Ht 64.0 in | Wt 119.0 lb

## 2016-06-01 DIAGNOSIS — R072 Precordial pain: Secondary | ICD-10-CM

## 2016-06-01 DIAGNOSIS — I471 Supraventricular tachycardia: Secondary | ICD-10-CM | POA: Diagnosis not present

## 2016-06-01 MED ORDER — NITROGLYCERIN 0.4 MG SL SUBL: SUBLINGUAL_TABLET | SUBLINGUAL | 3 refills | Status: DC

## 2016-06-01 MED ORDER — MECLIZINE HCL 25 MG PO TABS: 25.0000 mg | ORAL_TABLET | Freq: Four times a day (QID) | ORAL | 0 refills | Status: DC | PRN
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
## 2016-06-01 NOTE — Patient Instructions (Signed)
Medication Instructions:  Your physician recommends that you continue on your current medications as directed. Please refer to the Current Medication list given to you today.    Labwork: None ordered   Testing/Procedures: None ordered   Follow-Up: Your physician wants you to follow-up in: 12 months with Renee Ursuy, NP You will receive a reminder letter in the mail two months in advance. If you don't receive a letter, please call our office to schedule the follow-up appointment.   Any Other Special Instructions Will Be Listed Below (If Applicable).     If you need a refill on your cardiac medications before your next appointment, please call your pharmacy.   

## 2016-08-04 DIAGNOSIS — H6123 Impacted cerumen, bilateral: Secondary | ICD-10-CM | POA: Diagnosis not present

## 2016-10-13 DIAGNOSIS — E1169 Type 2 diabetes mellitus with other specified complication: Secondary | ICD-10-CM | POA: Diagnosis not present

## 2016-10-20 DIAGNOSIS — E119 Type 2 diabetes mellitus without complications: Secondary | ICD-10-CM | POA: Diagnosis not present

## 2016-10-20 DIAGNOSIS — Z1389 Encounter for screening for other disorder: Secondary | ICD-10-CM | POA: Diagnosis not present

## 2016-10-20 DIAGNOSIS — R079 Chest pain, unspecified: Secondary | ICD-10-CM | POA: Diagnosis not present

## 2016-10-20 DIAGNOSIS — Z760 Encounter for issue of repeat prescription: Secondary | ICD-10-CM | POA: Diagnosis not present

## 2016-11-09 DIAGNOSIS — R079 Chest pain, unspecified: Secondary | ICD-10-CM | POA: Diagnosis not present

## 2016-11-17 DIAGNOSIS — M810 Age-related osteoporosis without current pathological fracture: Secondary | ICD-10-CM | POA: Diagnosis not present

## 2016-12-01 ENCOUNTER — Telehealth: Payer: Self-pay | Admitting: Internal Medicine

## 2016-12-01 NOTE — Telephone Encounter (Signed)
Pt's dtr needs to speak to nure before patient's appt 12-05-16 with Allred, pls call dtr [name]

## 2016-12-01 NOTE — Telephone Encounter (Signed)
Letterhead faxed to [facility] to obtain a copy of Monitor.

## 2016-12-01 NOTE — Telephone Encounter (Signed)
Patient's daughter (DPR on file)  Is calling and states that she wanted to make Dr. Claiborne Billings aware that her mother's feelings of heart racing are all anxiety related. She states that the patient's PCP Dr. Ann Held ordered for her to wear a monitor for 2 weeks in March. She states that while the patient was wearing the monitor that they received a call that she had 8 rapid beats, but the patient was totally asymptomatic at the time. The daughter states that the family chose not to say anything to the patient and did not want to make her aware. They felt it would increase her anxiety.  The results of the monitor are not in our system. Will forward message to medical records to request monitor results from March from Dr. Herbie Baltimore Scott's office to have available for patient's appointment with Dr. [name] on 12/05/16.

## 2016-12-02 ENCOUNTER — Encounter: Payer: Self-pay | Admitting: Internal Medicine

## 2016-12-02 NOTE — Telephone Encounter (Signed)
Monitor Records received from [place]. Placed in Chart Prep room for  Allred 12/05/16 appointment.

## 2016-12-05 ENCOUNTER — Encounter: Payer: Self-pay | Admitting: Internal Medicine

## 2016-12-05 ENCOUNTER — Ambulatory Visit (INDEPENDENT_AMBULATORY_CARE_PROVIDER_SITE_OTHER): Payer: Medicare Other | Admitting: Internal Medicine

## 2016-12-05 ENCOUNTER — Encounter (INDEPENDENT_AMBULATORY_CARE_PROVIDER_SITE_OTHER): Payer: Self-pay

## 2016-12-05 VITALS — BP 110/58 | HR 91 | Ht 64.0 in | Wt 122.4 lb

## 2016-12-05 DIAGNOSIS — R072 Precordial pain: Secondary | ICD-10-CM

## 2016-12-05 DIAGNOSIS — I471 Supraventricular tachycardia: Secondary | ICD-10-CM | POA: Diagnosis not present
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
## 2016-12-05 NOTE — Patient Instructions (Signed)
Medication Instructions:  Your physician recommends that you continue on your current medications as directed. Please refer to the Current Medication list given to you today.   Labwork: None ordered   Testing/Procedures: None ordered   Follow-Up: Your physician recommends that you schedule a follow-up appointment in: 6 weeks with Dr [name]   Any Other Special Instructions Will Be Listed Below (If Applicable).     If you need a refill on your cardiac medications before your next appointment, please call your pharmacy.

## 2017-01-02 ENCOUNTER — Encounter: Payer: Self-pay | Admitting: *Deleted

## 2017-01-04 DIAGNOSIS — H353130 Nonexudative age-related macular degeneration, bilateral, stage unspecified: Secondary | ICD-10-CM | POA: Diagnosis not present

## 2017-01-04 DIAGNOSIS — E119 Type 2 diabetes mellitus without complications: Secondary | ICD-10-CM | POA: Diagnosis not present

## 2017-01-04 DIAGNOSIS — H4089 Other specified glaucoma: Secondary | ICD-10-CM | POA: Diagnosis not present

## 2017-01-18 ENCOUNTER — Encounter: Payer: Self-pay | Admitting: Internal Medicine

## 2017-01-18 ENCOUNTER — Ambulatory Visit (INDEPENDENT_AMBULATORY_CARE_PROVIDER_SITE_OTHER): Payer: Medicare Other | Admitting: Internal Medicine

## 2017-01-18 VITALS — BP 116/62 | HR 85 | Ht 64.0 in | Wt 123.4 lb

## 2017-01-18 DIAGNOSIS — R072 Precordial pain: Secondary | ICD-10-CM

## 2017-01-18 DIAGNOSIS — I471 Supraventricular tachycardia: Secondary | ICD-10-CM | POA: Diagnosis not present

## 2017-01-18 NOTE — Patient Instructions (Signed)
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
## 2017-04-13 DIAGNOSIS — E119 Type 2 diabetes mellitus without complications: Secondary | ICD-10-CM | POA: Diagnosis not present

## 2017-04-19 DIAGNOSIS — Z8679 Personal history of other diseases of the circulatory system: Secondary | ICD-10-CM | POA: Diagnosis not present

## 2017-04-19 DIAGNOSIS — Z0000 Encounter for general adult medical examination without abnormal findings: Secondary | ICD-10-CM | POA: Diagnosis not present

## 2017-04-19 DIAGNOSIS — I208 Other forms of angina pectoris: Secondary | ICD-10-CM | POA: Diagnosis not present

## 2017-04-19 DIAGNOSIS — E119 Type 2 diabetes mellitus without complications: Secondary | ICD-10-CM | POA: Diagnosis not present

## 2017-05-17 DIAGNOSIS — H401131 Primary open-angle glaucoma, bilateral, mild stage: Secondary | ICD-10-CM | POA: Diagnosis not present

## 2017-06-08 DIAGNOSIS — H353211 Exudative age-related macular degeneration, right eye, with active choroidal neovascularization: Secondary | ICD-10-CM | POA: Diagnosis not present

## 2017-06-14 DIAGNOSIS — Z23 Encounter for immunization: Secondary | ICD-10-CM | POA: Diagnosis not present

## 2017-07-13 DIAGNOSIS — H353211 Exudative age-related macular degeneration, right eye, with active choroidal neovascularization: Secondary | ICD-10-CM | POA: Diagnosis not present

## 2017-08-17 DIAGNOSIS — H353211 Exudative age-related macular degeneration, right eye, with active choroidal neovascularization: Secondary | ICD-10-CM | POA: Diagnosis not present

## 2017-08-18 ENCOUNTER — Encounter: Payer: Self-pay | Admitting: Sports Medicine

## 2017-08-18 ENCOUNTER — Ambulatory Visit (INDEPENDENT_AMBULATORY_CARE_PROVIDER_SITE_OTHER): Payer: Medicare Other | Admitting: Sports Medicine

## 2017-08-18 DIAGNOSIS — M79675 Pain in left toe(s): Secondary | ICD-10-CM

## 2017-08-18 DIAGNOSIS — E1142 Type 2 diabetes mellitus with diabetic polyneuropathy: Secondary | ICD-10-CM | POA: Diagnosis not present

## 2017-08-18 DIAGNOSIS — B351 Tinea unguium: Secondary | ICD-10-CM

## 2017-08-18 DIAGNOSIS — M79674 Pain in right toe(s): Secondary | ICD-10-CM

## 2017-08-18 NOTE — Progress Notes (Signed)
Subjective: Laura Swanson is a 83 y.o. female patient with history of diabetes who presents to office today complaining of long, painful nails  while ambulating in shoes; unable to trim. Patient states that the glucose reading this morning was 156 mg/dl. Patient denies any new changes in medication or new problems.   Saw PCP in Nov.   Patient Active Problem List   Diagnosis Date Noted  . TIA (transient ischemic attack) 06/02/2015  . Orthostatic hypotension 05/06/2013  . Hypertension 07/23/2012  . Chest pain 07/28/2011  . SVT (supraventricular tachycardia) ([place]) 07/28/2011  . Hyperlipidemia 07/28/2011   Current Outpatient Medications on File Prior to Visit  Medication Sig Dispense Refill  . calcium carbonate (OS-CAL) 600 MG TABS Take 600 mg by mouth daily.      . Cholecalciferol (VITAMIN D3) 2000 UNITS capsule Take 2,000 Units by mouth daily.     . Coenzyme Q10 (COQ-10) 100 MG CAPS Take 200 mg by mouth daily.    Marland Kitchen dipyridamole-aspirin (AGGRENOX) 200-25 MG 12hr capsule Take 1 capsule by mouth daily.    . meclizine (ANTIVERT) 25 MG tablet Take 1 tablet (25 mg total) by mouth every 6 (six) hours as needed for dizziness. 20 tablet 0  . metFORMIN (GLUCOPHAGE-XR) 500 MG 24 hr tablet Take 500 mg by mouth 2 (two) times daily.  4  . metoprolol tartrate (LOPRESSOR) 25 MG tablet Take 25 mg by mouth 2 (two) times daily.    . Multiple Vitamins-Minerals (PRESERVISION AREDS 2 PO) Take 1 capsule by mouth 2 (two) times daily.    . nitroGLYCERIN (NITROSTAT) 0.4 MG SL tablet PLACE 1 TABLET UNDER TONGUE EVERY 5 MINUTES AS NEEDED FOR CHEST PAIN.    MAX 3 TABLETS 25 tablet 3  . ONE TOUCH ULTRA TEST test strip   3  . ONGLYZA 5 MG TABS tablet Take 1 tablet by mouth daily.    . polyethylene glycol (MIRALAX / GLYCOLAX) packet Take 17 g by mouth daily.    . potassium chloride (K-DUR) 10 MEQ tablet Take 10 mEq by mouth 2 (two) times daily.  6  . Probiotic Product (PROBIOTIC PO) Take 1 capsule by mouth daily.    .  rosuvastatin (CRESTOR) 10 MG tablet Take 10 mg by mouth as directed. 1 tablet on Friday only     No current facility-administered medications on file prior to visit.    Allergies  Allergen Reactions  . Iodine Hives  . Repaglinide Other (See Comments)    Drop blood glucose and became very dizzy with near syncope   . Lipitor [Atorvastatin] Other (See Comments)    cramps  . Rosuvastatin Calcium Other (See Comments)    Cramps- tolerating taking once weekly  . Statins Other (See Comments)    Cramps   . Antiseptic Products, Misc.     Unknown  . Disinfectant Products Misc     Unknown  . Verapamil Other (See Comments)    constipation    No results found for this or any previous visit (from the past 2160 hour(s)).  Objective: General: Patient is awake, alert, and oriented x 3 and in no acute distress.  Integument: Skin is warm, dry and supple bilateral. Nails are tender, long, thickened and  dystrophic with subungual debris, consistent with onychomycosis, 1-5 bilateral. No signs of infection. No open lesions or preulcerative lesions present bilateral. Remaining integument unremarkable.  Vasculature:  Dorsalis Pedis pulse 1/4 bilateral. Posterior Tibial pulse  1/4 bilateral.  Capillary fill time <3 sec 1-5 bilateral. Positive hair  growth to the level of the digits. Temperature gradient within normal limits. No varicosities present bilateral. No edema present bilateral.   Neurology: The patient has intact sensation measured with a 5.07/10g Semmes Weinstein Monofilament at all pedal sites bilateral . Vibratory sensation diminished bilateral with tuning fork. No Babinski sign present bilateral.   Musculoskeletal: Asymptomatic bunion and hammertoe pedal deformities noted bilateral. Muscular strength 5/5 in all lower extremity muscular groups bilateral without pain on range of motion . No tenderness with calf compression bilateral.  Assessment and Plan: Problem List Items Addressed This  Visit    None    Visit Diagnoses    Pain due to onychomycosis of toenails of both feet    -  Primary   Diabetic polyneuropathy associated with type 2 diabetes mellitus ([place])          -Examined patient. -Discussed and educated patient on diabetic foot care, especially with  regards to the vascular, neurological and musculoskeletal systems.  -Stressed the importance of good glycemic control and the detriment of not  controlling glucose levels in relation to the foot. -Mechanically debrided all nails 1-5 bilateral using sterile nail nipper and filed with dremel without incident  -Dispensed foam toe separators  -Answered all patient questions -Patient to return  in 3 months for at risk foot care -Patient advised to call the office if any problems or questions arise in the meantime.  Landis Martins, DPM

## 2017-08-30 DIAGNOSIS — Z8673 Personal history of transient ischemic attack (TIA), and cerebral infarction without residual deficits: Secondary | ICD-10-CM | POA: Diagnosis not present

## 2017-08-30 DIAGNOSIS — J22 Unspecified acute lower respiratory infection: Secondary | ICD-10-CM | POA: Diagnosis not present

## 2017-08-30 DIAGNOSIS — Z6821 Body mass index (BMI) 21.0-21.9, adult: Secondary | ICD-10-CM | POA: Diagnosis not present

## 2017-10-10 DIAGNOSIS — E119 Type 2 diabetes mellitus without complications: Secondary | ICD-10-CM | POA: Diagnosis not present

## 2017-10-20 DIAGNOSIS — H353290 Exudative age-related macular degeneration, unspecified eye, stage unspecified: Secondary | ICD-10-CM | POA: Diagnosis not present

## 2017-10-20 DIAGNOSIS — Z9181 History of falling: Secondary | ICD-10-CM | POA: Diagnosis not present

## 2017-10-20 DIAGNOSIS — Z1339 Encounter for screening examination for other mental health and behavioral disorders: Secondary | ICD-10-CM | POA: Diagnosis not present

## 2017-10-20 DIAGNOSIS — E119 Type 2 diabetes mellitus without complications: Secondary | ICD-10-CM | POA: Diagnosis not present

## 2017-11-02 DIAGNOSIS — H353211 Exudative age-related macular degeneration, right eye, with active choroidal neovascularization: Secondary | ICD-10-CM | POA: Diagnosis not present

## 2017-11-17 ENCOUNTER — Ambulatory Visit (INDEPENDENT_AMBULATORY_CARE_PROVIDER_SITE_OTHER): Payer: Medicare Other | Admitting: Sports Medicine

## 2017-11-17 ENCOUNTER — Encounter: Payer: Self-pay | Admitting: Sports Medicine

## 2017-11-17 DIAGNOSIS — M79675 Pain in left toe(s): Secondary | ICD-10-CM

## 2017-11-17 DIAGNOSIS — B351 Tinea unguium: Secondary | ICD-10-CM

## 2017-11-17 DIAGNOSIS — M79674 Pain in right toe(s): Secondary | ICD-10-CM

## 2017-11-17 DIAGNOSIS — E1142 Type 2 diabetes mellitus with diabetic polyneuropathy: Secondary | ICD-10-CM

## 2017-11-17 NOTE — Progress Notes (Signed)
Subjective: Laura Swanson is a 84 y.o. female patient with history of diabetes who presents to office today complaining of long, painful nails  while ambulating in shoes; unable to trim. Patient states that the glucose reading this morning was 139 mg/dl. Patient denies any new changes in medication or new problems.   Saw PCP Dr. Nicki Reaper 1 week ago .   Patient Active Problem List   Diagnosis Date Noted  . TIA (transient ischemic attack) 06/02/2015  . Orthostatic hypotension 05/06/2013  . Hypertension 07/23/2012  . Chest pain 07/28/2011  . SVT (supraventricular tachycardia) ([place]) 07/28/2011  . Hyperlipidemia 07/28/2011   Current Outpatient Medications on File Prior to Visit  Medication Sig Dispense Refill  . calcium carbonate (OS-CAL) 600 MG TABS Take 600 mg by mouth daily.      . Cholecalciferol (VITAMIN D3) 2000 UNITS capsule Take 2,000 Units by mouth daily.     . Coenzyme Q10 (COQ-10) 100 MG CAPS Take 200 mg by mouth daily.    Marland Kitchen dipyridamole-aspirin (AGGRENOX) 200-25 MG 12hr capsule Take 1 capsule by mouth daily.    . meclizine (ANTIVERT) 25 MG tablet Take 1 tablet (25 mg total) by mouth every 6 (six) hours as needed for dizziness. (Patient not taking: Reported on 08/18/2017) 20 tablet 0  . metFORMIN (GLUCOPHAGE-XR) 500 MG 24 hr tablet Take 500 mg by mouth 2 (two) times daily.  4  . metoprolol tartrate (LOPRESSOR) 25 MG tablet Take 25 mg by mouth 2 (two) times daily.    . Multiple Vitamins-Minerals (PRESERVISION AREDS 2 PO) Take 1 capsule by mouth 2 (two) times daily.    . nitroGLYCERIN (NITROSTAT) 0.4 MG SL tablet PLACE 1 TABLET UNDER TONGUE EVERY 5 MINUTES AS NEEDED FOR CHEST PAIN.    MAX 3 TABLETS 25 tablet 3  . ONE TOUCH ULTRA TEST test strip   3  . ONGLYZA 5 MG TABS tablet Take 1 tablet by mouth daily.    . polyethylene glycol (MIRALAX / GLYCOLAX) packet Take 17 g by mouth daily.    . potassium chloride (K-DUR) 10 MEQ tablet Take 10 mEq by mouth 2 (two) times daily.  6  . Probiotic  Product (PROBIOTIC PO) Take 1 capsule by mouth daily.    . rosuvastatin (CRESTOR) 10 MG tablet Take 10 mg by mouth as directed. 1 tablet on Friday only     No current facility-administered medications on file prior to visit.    Allergies  Allergen Reactions  . Iodine Hives  . Repaglinide Other (See Comments)    Drop blood glucose and became very dizzy with near syncope   . Lipitor [Atorvastatin] Other (See Comments)    cramps  . Rosuvastatin Calcium Other (See Comments)    Cramps- tolerating taking once weekly  . Statins Other (See Comments)    Cramps   . Antiseptic Products, Misc.     Unknown  . Disinfectant Products Misc     Unknown  . Verapamil Other (See Comments)    constipation    No results found for this or any previous visit (from the past 2160 hour(s)).  Objective: General: Patient is awake, alert, and oriented x 3 and in no acute distress.  Integument: Skin is warm, dry and supple bilateral. Nails are tender, long, thickened and  dystrophic with subungual debris, consistent with onychomycosis, 1-5 bilateral. No signs of infection. No open lesions or preulcerative lesions present bilateral. Remaining integument unremarkable.  Vasculature:  Dorsalis Pedis pulse 1/4 bilateral. Posterior Tibial pulse  1/4 bilateral.  Capillary fill time <3 sec 1-5 bilateral. Positive hair growth to the level of the digits. Temperature gradient within normal limits. No varicosities present bilateral. No edema present bilateral.   Neurology: The patient has intact sensation measured with a 5.07/10g Semmes Weinstein Monofilament at all pedal sites bilateral . Vibratory sensation diminished bilateral with tuning fork. No Babinski sign present bilateral.   Musculoskeletal: Asymptomatic bunion and hammertoe pedal deformities noted bilateral. Muscular strength 5/5 in all lower extremity muscular groups bilateral without pain on range of motion . No tenderness with calf compression  bilateral.  Assessment and Plan: Problem List Items Addressed This Visit    None    Visit Diagnoses    Pain due to onychomycosis of toenails of both feet    -  Primary   Diabetic polyneuropathy associated with type 2 diabetes mellitus ([place])          -Examined patient. -Discussed and educated patient on diabetic foot care, especially with  regards to the vascular, neurological and musculoskeletal systems.  -Stressed the importance of good glycemic control and the detriment of not  controlling glucose levels in relation to the foot. -Mechanically debrided all nails 1-5 bilateral using sterile nail nipper and filed with dremel without incident  -Continue with foam toe separators  -Answered all patient questions -Patient to return  in 3 months for at risk foot care -Patient advised to call the office if any problems or questions arise in the meantime.  Landis Martins, DPM

## 2017-12-07 DIAGNOSIS — H353211 Exudative age-related macular degeneration, right eye, with active choroidal neovascularization: Secondary | ICD-10-CM | POA: Diagnosis not present

## 2018-01-04 DIAGNOSIS — H353211 Exudative age-related macular degeneration, right eye, with active choroidal neovascularization: Secondary | ICD-10-CM | POA: Diagnosis not present

## 2018-01-16 DIAGNOSIS — H401131 Primary open-angle glaucoma, bilateral, mild stage: Secondary | ICD-10-CM | POA: Diagnosis not present

## 2018-01-19 ENCOUNTER — Encounter: Payer: Self-pay | Admitting: Sports Medicine

## 2018-01-19 ENCOUNTER — Ambulatory Visit (INDEPENDENT_AMBULATORY_CARE_PROVIDER_SITE_OTHER): Payer: Medicare Other | Admitting: Sports Medicine

## 2018-01-19 DIAGNOSIS — E1142 Type 2 diabetes mellitus with diabetic polyneuropathy: Secondary | ICD-10-CM

## 2018-01-19 DIAGNOSIS — B351 Tinea unguium: Secondary | ICD-10-CM

## 2018-01-19 DIAGNOSIS — M21619 Bunion of unspecified foot: Secondary | ICD-10-CM

## 2018-01-19 DIAGNOSIS — M79674 Pain in right toe(s): Secondary | ICD-10-CM | POA: Diagnosis not present

## 2018-01-19 DIAGNOSIS — M79675 Pain in left toe(s): Secondary | ICD-10-CM

## 2018-01-19 NOTE — Progress Notes (Signed)
Subjective: Laura Swanson is a 84 y.o. female patient with history of diabetes who presents to office today complaining of long, painful nails  while ambulating in shoes; unable to trim. Patient states that the glucose reading this morning was 142 mg/dl. Patient states that she has been using padding over her right bunion and states that it does not hurt as long as anything is not rubbing it.  Denies any new changes in medication or new problems since last visit.  Patient saw her primary care Dr. Nicki Reaper 3 months ago and is assisted by her daughter this visit.  Patient Active Problem List   Diagnosis Date Noted  . TIA (transient ischemic attack) 06/02/2015  . Orthostatic hypotension 05/06/2013  . Hypertension 07/23/2012  . Chest pain 07/28/2011  . SVT (supraventricular tachycardia) ([place]) 07/28/2011  . Hyperlipidemia 07/28/2011   Current Outpatient Medications on File Prior to Visit  Medication Sig Dispense Refill  . calcium carbonate (OS-CAL) 600 MG TABS Take 600 mg by mouth daily.      . Cholecalciferol (VITAMIN D3) 2000 UNITS capsule Take 2,000 Units by mouth daily.     . Coenzyme Q10 (COQ-10) 100 MG CAPS Take 200 mg by mouth daily.    Marland Kitchen dipyridamole-aspirin (AGGRENOX) 200-25 MG 12hr capsule Take 1 capsule by mouth daily.    . meclizine (ANTIVERT) 25 MG tablet Take 1 tablet (25 mg total) by mouth every 6 (six) hours as needed for dizziness. 20 tablet 0  . metFORMIN (GLUCOPHAGE-XR) 500 MG 24 hr tablet Take 500 mg by mouth 2 (two) times daily.  4  . metoprolol tartrate (LOPRESSOR) 25 MG tablet Take 25 mg by mouth 2 (two) times daily.    . Multiple Vitamins-Minerals (PRESERVISION AREDS 2 PO) Take 1 capsule by mouth 2 (two) times daily.    . nitroGLYCERIN (NITROSTAT) 0.4 MG SL tablet PLACE 1 TABLET UNDER TONGUE EVERY 5 MINUTES AS NEEDED FOR CHEST PAIN.    MAX 3 TABLETS 25 tablet 3  . ONE TOUCH ULTRA TEST test strip   3  . ONGLYZA 5 MG TABS tablet Take 1 tablet by mouth daily.    . polyethylene  glycol (MIRALAX / GLYCOLAX) packet Take 17 g by mouth daily.    . potassium chloride (K-DUR) 10 MEQ tablet Take 10 mEq by mouth 2 (two) times daily.  6  . Probiotic Product (PROBIOTIC PO) Take 1 capsule by mouth daily.    . rosuvastatin (CRESTOR) 10 MG tablet Take 10 mg by mouth as directed. 1 tablet on Friday only     No current facility-administered medications on file prior to visit.    Allergies  Allergen Reactions  . Iodine Hives  . Repaglinide Other (See Comments)    Drop blood glucose and became very dizzy with near syncope   . Lipitor [Atorvastatin] Other (See Comments)    cramps  . Rosuvastatin Calcium Other (See Comments)    Cramps- tolerating taking once weekly  . Statins Other (See Comments)    Cramps   . Antiseptic Products, Misc.     Unknown  . Disinfectant Products Misc     Unknown  . Verapamil Other (See Comments)    constipation    No results found for this or any previous visit (from the past 2160 hour(s)).  Objective: General: Patient is awake, alert, and oriented x 3 and in no acute distress.  Integument: Skin is warm, dry and supple bilateral. Nails are tender, long, thickened and  dystrophic with subungual debris, consistent with  onychomycosis, 1-5 bilateral. No signs of infection. No open lesions or preulcerative lesions present bilateral. Remaining integument unremarkable.  Vasculature:  Dorsalis Pedis pulse 1/4 bilateral. Posterior Tibial pulse  1/4 bilateral.  Capillary fill time <3 sec 1-5 bilateral. Positive hair growth to the level of the digits. Temperature gradient within normal limits. No varicosities present bilateral. No edema present bilateral.   Neurology: The patient has intact sensation measured with a 5.07/10g Semmes Weinstein Monofilament at all pedal sites bilateral . Vibratory sensation diminished bilateral with tuning fork. No Babinski sign present bilateral.   Musculoskeletal: Asymptomatic bunion and hammertoe pedal deformities  noted bilateral. Muscular strength 5/5 in all lower extremity muscular groups bilateral without pain on range of motion . No tenderness with calf compression bilateral.  Assessment and Plan: Problem List Items Addressed This Visit    None    Visit Diagnoses    Pain due to onychomycosis of toenails of both feet    -  Primary   Diabetic polyneuropathy associated with type 2 diabetes mellitus ([place])       Bunion          -Examined patient. -Discussed and educated patient on diabetic foot care, especially with  regards to the vascular, neurological and musculoskeletal systems.  -Stressed the importance of good glycemic control and the detriment of not  controlling glucose levels in relation to the foot. -Mechanically debrided all nails 1-5 bilateral using sterile nail nipper and filed with dremel without incident  -Continue with foam toe separators and bunion cushion on right -Answered all patient questions -Patient to return  in 3 months for at risk foot care -Patient advised to call the office if any problems or questions arise in the meantime.  Landis Martins, DPM

## 2018-02-08 DIAGNOSIS — H353212 Exudative age-related macular degeneration, right eye, with inactive choroidal neovascularization: Secondary | ICD-10-CM | POA: Diagnosis not present

## 2018-03-22 DIAGNOSIS — H353212 Exudative age-related macular degeneration, right eye, with inactive choroidal neovascularization: Secondary | ICD-10-CM | POA: Diagnosis not present

## 2018-03-28 ENCOUNTER — Encounter: Payer: Self-pay | Admitting: Sports Medicine

## 2018-03-28 ENCOUNTER — Ambulatory Visit (INDEPENDENT_AMBULATORY_CARE_PROVIDER_SITE_OTHER): Payer: Medicare Other | Admitting: Sports Medicine

## 2018-03-28 VITALS — BP 115/69 | HR 93 | Temp 98.0°F | Resp 15

## 2018-03-28 DIAGNOSIS — M79675 Pain in left toe(s): Secondary | ICD-10-CM | POA: Diagnosis not present

## 2018-03-28 DIAGNOSIS — B351 Tinea unguium: Secondary | ICD-10-CM

## 2018-03-28 DIAGNOSIS — E1142 Type 2 diabetes mellitus with diabetic polyneuropathy: Secondary | ICD-10-CM

## 2018-03-28 DIAGNOSIS — M79674 Pain in right toe(s): Secondary | ICD-10-CM | POA: Diagnosis not present

## 2018-03-28 NOTE — Progress Notes (Signed)
Subjective: Laura Swanson is a 84 y.o. female patient with history of diabetes who presents to office today complaining of long, painful nails  while ambulating in shoes; unable to trim. Patient states that the glucose reading this morning was 142 mg/dl.  Denies any new changes in medication but does report that she is suffering with macular degeneration and will have an eye procedure with shots and treatment for her glaucoma by her eye doctor.  Patient is assisted by her daughter at this visit.  Patient saw her primary care Dr. Nicki Reaper 1-2 months ago.   Patient Active Problem List   Diagnosis Date Noted  . TIA (transient ischemic attack) 06/02/2015  . Orthostatic hypotension 05/06/2013  . Hypertension 07/23/2012  . Chest pain 07/28/2011  . SVT (supraventricular tachycardia) ([place]) 07/28/2011  . Hyperlipidemia 07/28/2011   Current Outpatient Medications on File Prior to Visit  Medication Sig Dispense Refill  . calcium carbonate (OS-CAL) 600 MG TABS Take 600 mg by mouth daily.      . Cholecalciferol (VITAMIN D3) 2000 UNITS capsule Take 2,000 Units by mouth daily.     . Coenzyme Q10 (COQ-10) 100 MG CAPS Take 200 mg by mouth daily.    Marland Kitchen dipyridamole-aspirin (AGGRENOX) 200-25 MG 12hr capsule Take 1 capsule by mouth daily.    . meclizine (ANTIVERT) 25 MG tablet Take 1 tablet (25 mg total) by mouth every 6 (six) hours as needed for dizziness. 20 tablet 0  . metFORMIN (GLUCOPHAGE-XR) 500 MG 24 hr tablet Take 500 mg by mouth 2 (two) times daily.  4  . metoprolol tartrate (LOPRESSOR) 25 MG tablet Take 25 mg by mouth 2 (two) times daily.    . Multiple Vitamins-Minerals (PRESERVISION AREDS 2 PO) Take 1 capsule by mouth 2 (two) times daily.    . nitroGLYCERIN (NITROSTAT) 0.4 MG SL tablet PLACE 1 TABLET UNDER TONGUE EVERY 5 MINUTES AS NEEDED FOR CHEST PAIN.    MAX 3 TABLETS 25 tablet 3  . ONE TOUCH ULTRA TEST test strip   3  . ONGLYZA 5 MG TABS tablet Take 1 tablet by mouth daily.    . polyethylene glycol  (MIRALAX / GLYCOLAX) packet Take 17 g by mouth daily.    . potassium chloride (K-DUR) 10 MEQ tablet Take 10 mEq by mouth 2 (two) times daily.  6  . Probiotic Product (PROBIOTIC PO) Take 1 capsule by mouth daily.    . rosuvastatin (CRESTOR) 10 MG tablet Take 10 mg by mouth as directed. 1 tablet on Friday only     No current facility-administered medications on file prior to visit.    Allergies  Allergen Reactions  . Iodine Hives  . Repaglinide Other (See Comments)    Drop blood glucose and became very dizzy with near syncope   . Lipitor [Atorvastatin] Other (See Comments)    cramps  . Rosuvastatin Calcium Other (See Comments)    Cramps- tolerating taking once weekly  . Statins Other (See Comments)    Cramps   . Antiseptic Products, Misc.     Unknown  . Disinfectant Products Misc     Unknown  . Verapamil Other (See Comments)    constipation    No results found for this or any previous visit (from the past 2160 hour(s)).  Objective: General: Patient is awake, alert, and oriented x 3 and in no acute distress.  Integument: Skin is warm, dry and supple bilateral. Nails are tender, long, thickened and  dystrophic with subungual debris, consistent with onychomycosis, 1-5 bilateral.  No signs of infection. No open lesions or preulcerative lesions present bilateral. Remaining integument unremarkable.  Vasculature:  Dorsalis Pedis pulse 1/4 bilateral. Posterior Tibial pulse  1/4 bilateral.  Capillary fill time <3 sec 1-5 bilateral. Positive hair growth to the level of the digits. Temperature gradient within normal limits. No varicosities present bilateral. No edema present bilateral.   Neurology: The patient has intact sensation measured with a 5.07/10g Semmes Weinstein Monofilament at all pedal sites bilateral . Vibratory sensation diminished bilateral with tuning fork. No Babinski sign present bilateral.   Musculoskeletal: Asymptomatic bunion and hammertoe pedal deformities noted  bilateral. Muscular strength 5/5 in all lower extremity muscular groups bilateral without pain on range of motion . No tenderness with calf compression bilateral.  Assessment and Plan: Problem List Items Addressed This Visit    None    Visit Diagnoses    Pain due to onychomycosis of toenails of both feet    -  Primary   Diabetic polyneuropathy associated with type 2 diabetes mellitus ([place])          -Examined patient. -Discussed and educated patient on diabetic foot care, especially with  regards to the vascular, neurological and musculoskeletal systems.  -Stressed the importance of good glycemic control and the detriment of not  controlling glucose levels in relation to the foot. -Mechanically debrided all nails 1-5 bilateral using sterile nail nipper and filed with dremel without incident  -Continue with foam toe separators and bunion cushion on right as needed -Patient to return  in 3 months for at risk foot care -Patient advised to call the office if any problems or questions arise in the meantime.  Landis Martins, DPM

## 2018-04-13 DIAGNOSIS — E119 Type 2 diabetes mellitus without complications: Secondary | ICD-10-CM | POA: Diagnosis not present

## 2018-04-13 DIAGNOSIS — H66009 Acute suppurative otitis media without spontaneous rupture of ear drum, unspecified ear: Secondary | ICD-10-CM | POA: Diagnosis not present

## 2018-04-19 ENCOUNTER — Ambulatory Visit: Payer: Medicare Other | Admitting: Sports Medicine

## 2018-04-20 DIAGNOSIS — Z6821 Body mass index (BMI) 21.0-21.9, adult: Secondary | ICD-10-CM | POA: Diagnosis not present

## 2018-04-20 DIAGNOSIS — E119 Type 2 diabetes mellitus without complications: Secondary | ICD-10-CM | POA: Diagnosis not present

## 2018-04-20 DIAGNOSIS — Z0000 Encounter for general adult medical examination without abnormal findings: Secondary | ICD-10-CM | POA: Diagnosis not present

## 2018-05-10 ENCOUNTER — Other Ambulatory Visit: Payer: Self-pay

## 2018-05-10 DIAGNOSIS — H353122 Nonexudative age-related macular degeneration, left eye, intermediate dry stage: Secondary | ICD-10-CM | POA: Diagnosis not present

## 2018-05-10 DIAGNOSIS — H353211 Exudative age-related macular degeneration, right eye, with active choroidal neovascularization: Secondary | ICD-10-CM | POA: Diagnosis not present

## 2018-05-15 DIAGNOSIS — H401131 Primary open-angle glaucoma, bilateral, mild stage: Secondary | ICD-10-CM | POA: Diagnosis not present

## 2018-05-21 ENCOUNTER — Ambulatory Visit: Payer: Medicare Other | Admitting: Internal Medicine
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
## 2018-05-22 ENCOUNTER — Ambulatory Visit (INDEPENDENT_AMBULATORY_CARE_PROVIDER_SITE_OTHER): Payer: Medicare Other | Admitting: Cardiology

## 2018-05-22 ENCOUNTER — Encounter: Payer: Self-pay | Admitting: Cardiology

## 2018-05-22 VITALS — BP 110/62 | HR 93 | Ht 64.0 in | Wt 126.0 lb

## 2018-05-22 DIAGNOSIS — I10 Essential (primary) hypertension: Secondary | ICD-10-CM | POA: Diagnosis not present

## 2018-05-22 DIAGNOSIS — E785 Hyperlipidemia, unspecified: Secondary | ICD-10-CM | POA: Diagnosis not present

## 2018-05-22 DIAGNOSIS — I471 Supraventricular tachycardia: Secondary | ICD-10-CM

## 2018-05-22 NOTE — Patient Instructions (Signed)
Medication Instructions:  Your physician recommends that you continue on your current medications as directed. Please refer to the Current Medication list given to you today.   Labwork: None  Testing/Procedures: You had an EKG today.   Follow-Up: Your physician wants you to follow-up in: 1 year. You will receive a reminder letter in the mail two months in advance. If you don't receive a letter, please call our office to schedule the follow-up appointment.   If you need a refill on your cardiac medications before your next appointment, please call your pharmacy.   Thank you for choosing [facility]! Catherine Lockhart, RN [phone]    

## 2018-05-30 ENCOUNTER — Encounter: Payer: Self-pay | Admitting: Sports Medicine

## 2018-05-30 ENCOUNTER — Ambulatory Visit (INDEPENDENT_AMBULATORY_CARE_PROVIDER_SITE_OTHER): Payer: Medicare Other | Admitting: Sports Medicine

## 2018-05-30 VITALS — BP 118/65 | HR 94 | Resp 16

## 2018-05-30 DIAGNOSIS — E1142 Type 2 diabetes mellitus with diabetic polyneuropathy: Secondary | ICD-10-CM

## 2018-05-30 DIAGNOSIS — M79674 Pain in right toe(s): Secondary | ICD-10-CM | POA: Diagnosis not present

## 2018-05-30 DIAGNOSIS — M79675 Pain in left toe(s): Secondary | ICD-10-CM | POA: Diagnosis not present

## 2018-05-30 DIAGNOSIS — B351 Tinea unguium: Secondary | ICD-10-CM

## 2018-05-30 NOTE — Progress Notes (Signed)
Subjective: Laura Swanson is a 84 y.o. female patient with history of diabetes who presents to office today complaining of long, painful nails  while ambulating in shoes; unable to trim. Patient states that the glucose reading this morning was 130 mg/dl.  Denies any new changes in medication but does report that she is suffering with macular degeneration and had shots with her eyes and since has had visual disturbances especially when reading numbers or letters otherwise patient denies any other medical concerns at this time.  Patient is assisted by her daughter at this visit.  Patient saw her primary care Dr. Nicki Reaper several months ago and will be changing to Dr. Venetia Maxon.   Patient Active Problem List   Diagnosis Date Noted  . TIA (transient ischemic attack) 06/02/2015  . Temporary cerebral vascular dysfunction 06/02/2015  . Transient cerebral ischemia 04/28/2015  . Orthostatic hypotension 05/06/2013  . Hypertension 07/23/2012  . Chest pain 07/28/2011  . SVT (supraventricular tachycardia) ([place]) 07/28/2011  . Hyperlipidemia 07/28/2011   Current Outpatient Medications on File Prior to Visit  Medication Sig Dispense Refill  . calcium carbonate (OS-CAL) 600 MG TABS Take 600 mg by mouth daily.      . Cholecalciferol (VITAMIN D3) 2000 UNITS capsule Take 2,000 Units by mouth daily.     . Coenzyme Q10 (COQ-10) 100 MG CAPS Take 200 mg by mouth daily.    Marland Kitchen dipyridamole-aspirin (AGGRENOX) 200-25 MG 12hr capsule Take 1 capsule by mouth daily.    . Lactobacillus (FLORAJEN ACIDOPHILUS) CAPS Take 1 capsule by mouth daily.  99  . metFORMIN (GLUCOPHAGE-XR) 500 MG 24 hr tablet Take 500 mg by mouth 2 (two) times daily.  4  . Multiple Vitamins-Minerals (PRESERVISION AREDS 2 PO) Take 1 capsule by mouth 2 (two) times daily.    . NESINA 25 MG TABS Take 1 tablet by mouth daily.  12  . nitroGLYCERIN (NITROSTAT) 0.4 MG SL tablet PLACE 1 TABLET UNDER TONGUE EVERY 5 MINUTES AS NEEDED FOR CHEST PAIN.    MAX 3 TABLETS 25  tablet 3  . ONE TOUCH ULTRA TEST test strip   3  . polyethylene glycol (MIRALAX / GLYCOLAX) packet Take 17 g by mouth daily.    . rosuvastatin (CRESTOR) 10 MG tablet Take 10 mg by mouth as directed. 1 tablet on Friday only    . vitamin C (ASCORBIC ACID) 500 MG tablet Take 1,000 mg by mouth 2 (two) times daily.     No current facility-administered medications on file prior to visit.    Allergies  Allergen Reactions  . Iodine Hives  . Repaglinide Other (See Comments)    Drop blood glucose and became very dizzy with near syncope   . Lipitor [Atorvastatin] Other (See Comments)    cramps  . Rosuvastatin Calcium Other (See Comments)    Cramps- tolerating taking once weekly Cramps  . Statins Other (See Comments)    Cramps   . Antiseptic Products, Misc.     Unknown  . Disinfectant Products Misc     Unknown  . Verapamil Other (See Comments)    constipation    No results found for this or any previous visit (from the past 2160 hour(s)).  Objective: General: Patient is awake, alert, and oriented x 3 and in no acute distress.  Integument: Skin is warm, dry and supple bilateral. Nails are tender, long, thickened and  dystrophic with subungual debris, consistent with onychomycosis, 1-5 bilateral.  Mild reactive callus at the medial aspect of right second toe.  No signs of infection. No open lesions or preulcerative lesions present bilateral. Remaining integument unremarkable.  Vasculature:  Dorsalis Pedis pulse 1/4 bilateral. Posterior Tibial pulse  1/4 bilateral.  Capillary fill time <3 sec 1-5 bilateral. Positive hair growth to the level of the digits. Temperature gradient within normal limits. No varicosities present bilateral. No edema present bilateral.   Neurology: The patient has intact sensation measured with a 5.07/10g Semmes Weinstein Monofilament at all pedal sites bilateral . Vibratory sensation diminished bilateral with tuning fork. No Babinski sign present bilateral.    Musculoskeletal: Asymptomatic bunion and hammertoe pedal deformities noted bilateral. Muscular strength 5/5 in all lower extremity muscular groups bilateral without pain on range of motion . No tenderness with calf compression bilateral.  Assessment and Plan: Problem List Items Addressed This Visit    None    Visit Diagnoses    Pain due to onychomycosis of toenails of both feet    -  Primary   Diabetic polyneuropathy associated with type 2 diabetes mellitus ([place])          -Examined patient. -Discussed and educated patient on diabetic foot care, especially with  regards to the vascular, neurological and musculoskeletal systems.  -Mechanically debrided all nails 1-5 bilateral using sterile nail nipper and filed with dremel without incident  -Continue with toe spacers as needed right first and second toes -Patient to return in 63 days for at risk foot care -Patient advised to call the office if any problems or questions arise in the meantime.  Landis Martins, DPM

## 2018-06-07 DIAGNOSIS — Z23 Encounter for immunization: Secondary | ICD-10-CM | POA: Diagnosis not present

## 2018-06-28 DIAGNOSIS — H401131 Primary open-angle glaucoma, bilateral, mild stage: Secondary | ICD-10-CM | POA: Diagnosis not present

## 2018-08-01 ENCOUNTER — Encounter: Payer: Self-pay | Admitting: Sports Medicine

## 2018-08-01 ENCOUNTER — Ambulatory Visit (INDEPENDENT_AMBULATORY_CARE_PROVIDER_SITE_OTHER): Payer: Medicare Other | Admitting: Sports Medicine

## 2018-08-01 VITALS — BP 119/62 | HR 81 | Resp 16

## 2018-08-01 DIAGNOSIS — M79675 Pain in left toe(s): Secondary | ICD-10-CM | POA: Diagnosis not present

## 2018-08-01 DIAGNOSIS — M79674 Pain in right toe(s): Secondary | ICD-10-CM

## 2018-08-01 DIAGNOSIS — B351 Tinea unguium: Secondary | ICD-10-CM

## 2018-08-01 DIAGNOSIS — E1142 Type 2 diabetes mellitus with diabetic polyneuropathy: Secondary | ICD-10-CM

## 2018-08-01 NOTE — Progress Notes (Signed)
Subjective: Laura Swanson is a 84 y.o. female patient with history of diabetes who presents to office today complaining of long, painful nails  while ambulating in shoes; unable to trim. Patient states that the glucose reading this morning was 129 mg/dl.  Denies any new changes in medication but does report that she is still having problems with her eyes.  Patient is assisted by her daughter at this visit.  Patient said her primary care doctor changed to Dr.Street.   Patient Active Problem List   Diagnosis Date Noted  . TIA (transient ischemic attack) 06/02/2015  . Temporary cerebral vascular dysfunction 06/02/2015  . Transient cerebral ischemia 04/28/2015  . Orthostatic hypotension 05/06/2013  . Hypertension 07/23/2012  . Chest pain 07/28/2011  . SVT (supraventricular tachycardia) ([place]) 07/28/2011  . Hyperlipidemia 07/28/2011   Current Outpatient Medications on File Prior to Visit  Medication Sig Dispense Refill  . calcium carbonate (OS-CAL) 600 MG TABS Take 600 mg by mouth daily.      . Cholecalciferol (VITAMIN D3) 2000 UNITS capsule Take 2,000 Units by mouth daily.     . Coenzyme Q10 (COQ-10) 100 MG CAPS Take 200 mg by mouth daily.    Marland Kitchen dipyridamole-aspirin (AGGRENOX) 200-25 MG 12hr capsule Take 1 capsule by mouth daily.    . Lactobacillus (FLORAJEN ACIDOPHILUS) CAPS Take 1 capsule by mouth daily.  99  . metFORMIN (GLUCOPHAGE-XR) 500 MG 24 hr tablet Take 500 mg by mouth 2 (two) times daily.  4  . Multiple Vitamins-Minerals (PRESERVISION AREDS 2 PO) Take 1 capsule by mouth 2 (two) times daily.    . NESINA 25 MG TABS Take 1 tablet by mouth daily.  12  . nitroGLYCERIN (NITROSTAT) 0.4 MG SL tablet PLACE 1 TABLET UNDER TONGUE EVERY 5 MINUTES AS NEEDED FOR CHEST PAIN.    MAX 3 TABLETS 25 tablet 3  . ONE TOUCH ULTRA TEST test strip   3  . polyethylene glycol (MIRALAX / GLYCOLAX) packet Take 17 g by mouth daily.    . rosuvastatin (CRESTOR) 10 MG tablet Take 10 mg by mouth as directed. 1 tablet  on Friday only    . vitamin C (ASCORBIC ACID) 500 MG tablet Take 1,000 mg by mouth 2 (two) times daily.     No current facility-administered medications on file prior to visit.    Allergies  Allergen Reactions  . Iodine Hives  . Repaglinide Other (See Comments)    Drop blood glucose and became very dizzy with near syncope   . Lipitor [Atorvastatin] Other (See Comments)    cramps  . Rosuvastatin Calcium Other (See Comments)    Cramps- tolerating taking once weekly Cramps  . Statins Other (See Comments)    Cramps   . Antiseptic Products, Misc.     Unknown  . Disinfectant Products Misc     Unknown  . Verapamil Other (See Comments)    constipation    No results found for this or any previous visit (from the past 2160 hour(s)).  Objective: General: Patient is awake, alert, and oriented x 3 and in no acute distress.  Integument: Skin is warm, dry and supple bilateral. Nails are tender, long, thickened and  dystrophic with subungual debris, consistent with onychomycosis, 1-5 bilateral.  Mild reactive callus at the medial aspect of right second toe.  No signs of infection. No open lesions or preulcerative lesions present bilateral. Remaining integument unremarkable.  Vasculature:  Dorsalis Pedis pulse 1/4 bilateral. Posterior Tibial pulse  1/4 bilateral.  Capillary fill time <3  sec 1-5 bilateral. Positive hair growth to the level of the digits. Temperature gradient within normal limits. No varicosities present bilateral. No edema present bilateral.   Neurology: The patient has intact sensation measured with a 5.07/10g Semmes Weinstein Monofilament at all pedal sites bilateral . Vibratory sensation diminished bilateral with tuning fork. No Babinski sign present bilateral.   Musculoskeletal: Asymptomatic bunion and hammertoe pedal deformities noted bilateral. Muscular strength 5/5 in all lower extremity muscular groups bilateral without pain on range of motion . No tenderness with calf  compression bilateral.  Assessment and Plan: Problem List Items Addressed This Visit    None    Visit Diagnoses    Pain due to onychomycosis of toenails of both feet    -  Primary   Diabetic polyneuropathy associated with type 2 diabetes mellitus ([place])          -Examined patient. -Discussed and educated patient on diabetic foot care, especially with  regards to the vascular, neurological and musculoskeletal systems.  -Mechanically debrided all nails 1-5 bilateral using sterile nail nipper and filed with dremel without incident  -Dispensed toe spacers to use as needed between the right first and second toes -Patient to return in 63 days for at risk foot care -Patient advised to call the office if any problems or questions arise in the meantime.  Landis Martins, DPM

## 2018-08-08 DIAGNOSIS — S90121A Contusion of right lesser toe(s) without damage to nail, initial encounter: Secondary | ICD-10-CM | POA: Diagnosis not present

## 2018-08-08 DIAGNOSIS — S9031XA Contusion of right foot, initial encounter: Secondary | ICD-10-CM | POA: Diagnosis not present

## 2018-08-08 DIAGNOSIS — Z6821 Body mass index (BMI) 21.0-21.9, adult: Secondary | ICD-10-CM | POA: Diagnosis not present

## 2018-08-08 DIAGNOSIS — S99921A Unspecified injury of right foot, initial encounter: Secondary | ICD-10-CM | POA: Diagnosis not present

## 2018-08-09 DIAGNOSIS — E113293 Type 2 diabetes mellitus with mild nonproliferative diabetic retinopathy without macular edema, bilateral: Secondary | ICD-10-CM | POA: Diagnosis not present

## 2018-08-09 DIAGNOSIS — H353122 Nonexudative age-related macular degeneration, left eye, intermediate dry stage: Secondary | ICD-10-CM | POA: Diagnosis not present

## 2018-08-09 DIAGNOSIS — H353212 Exudative age-related macular degeneration, right eye, with inactive choroidal neovascularization: Secondary | ICD-10-CM | POA: Diagnosis not present

## 2018-08-14 DIAGNOSIS — R05 Cough: Secondary | ICD-10-CM | POA: Diagnosis not present

## 2018-08-14 DIAGNOSIS — R0981 Nasal congestion: Secondary | ICD-10-CM | POA: Diagnosis not present

## 2018-09-18 DIAGNOSIS — L578 Other skin changes due to chronic exposure to nonionizing radiation: Secondary | ICD-10-CM | POA: Diagnosis not present

## 2018-09-18 DIAGNOSIS — R251 Tremor, unspecified: Secondary | ICD-10-CM | POA: Diagnosis not present

## 2018-09-18 DIAGNOSIS — L570 Actinic keratosis: Secondary | ICD-10-CM | POA: Diagnosis not present

## 2018-09-18 DIAGNOSIS — L814 Other melanin hyperpigmentation: Secondary | ICD-10-CM | POA: Diagnosis not present

## 2018-10-03 ENCOUNTER — Ambulatory Visit (INDEPENDENT_AMBULATORY_CARE_PROVIDER_SITE_OTHER): Payer: Medicare Other | Admitting: Sports Medicine

## 2018-10-03 ENCOUNTER — Encounter: Payer: Self-pay | Admitting: Sports Medicine

## 2018-10-03 VITALS — BP 112/68 | HR 92 | Resp 16

## 2018-10-03 DIAGNOSIS — M79674 Pain in right toe(s): Secondary | ICD-10-CM | POA: Diagnosis not present

## 2018-10-03 DIAGNOSIS — M79675 Pain in left toe(s): Secondary | ICD-10-CM

## 2018-10-03 DIAGNOSIS — B351 Tinea unguium: Secondary | ICD-10-CM

## 2018-10-03 DIAGNOSIS — E1142 Type 2 diabetes mellitus with diabetic polyneuropathy: Secondary | ICD-10-CM

## 2018-10-03 NOTE — Progress Notes (Signed)
Subjective: Laura Swanson is a 85 y.o. female patient with history of diabetes who presents to office today complaining of long, painful nails  while ambulating in shoes; unable to trim. Patient states that the glucose reading this morning was 140 mg/dl.  Denies any new changes in medication but does report that she is still having problems with her eyes/can't see well.  Patient is assisted by her daughter at this visit.  Will see PCP Dr. Venetia Maxon on next week   Patient Active Problem List   Diagnosis Date Noted  . TIA (transient ischemic attack) 06/02/2015  . Temporary cerebral vascular dysfunction 06/02/2015  . Transient cerebral ischemia 04/28/2015  . Orthostatic hypotension 05/06/2013  . Hypertension 07/23/2012  . Chest pain 07/28/2011  . SVT (supraventricular tachycardia) ([place]) 07/28/2011  . Hyperlipidemia 07/28/2011   Current Outpatient Medications on File Prior to Visit  Medication Sig Dispense Refill  . calcium carbonate (OS-CAL) 600 MG TABS Take 600 mg by mouth daily.      . Cholecalciferol (VITAMIN D3) 2000 UNITS capsule Take 2,000 Units by mouth daily.     . Coenzyme Q10 (COQ-10) 100 MG CAPS Take 200 mg by mouth daily.    Marland Kitchen dipyridamole-aspirin (AGGRENOX) 200-25 MG 12hr capsule Take 1 capsule by mouth daily.    . Lactobacillus (FLORAJEN ACIDOPHILUS) CAPS Take 1 capsule by mouth daily.  99  . metFORMIN (GLUCOPHAGE-XR) 500 MG 24 hr tablet Take 500 mg by mouth 2 (two) times daily.  4  . Multiple Vitamins-Minerals (PRESERVISION AREDS 2 PO) Take 1 capsule by mouth 2 (two) times daily.    . NESINA 25 MG TABS Take 1 tablet by mouth daily.  12  . nitroGLYCERIN (NITROSTAT) 0.4 MG SL tablet PLACE 1 TABLET UNDER TONGUE EVERY 5 MINUTES AS NEEDED FOR CHEST PAIN.    MAX 3 TABLETS 25 tablet 3  . ONE TOUCH ULTRA TEST test strip   3  . polyethylene glycol (MIRALAX / GLYCOLAX) packet Take 17 g by mouth daily.    . rosuvastatin (CRESTOR) 10 MG tablet Take 10 mg by mouth as directed. 1 tablet on  Friday only    . vitamin C (ASCORBIC ACID) 500 MG tablet Take 1,000 mg by mouth 2 (two) times daily.     No current facility-administered medications on file prior to visit.    Allergies  Allergen Reactions  . Iodine Hives  . Repaglinide Other (See Comments)    Drop blood glucose and became very dizzy with near syncope   . Lipitor [Atorvastatin] Other (See Comments)    cramps  . Rosuvastatin Calcium Other (See Comments)    Cramps- tolerating taking once weekly Cramps  . Statins Other (See Comments)    Cramps   . Antiseptic Products, Misc.     Unknown  . Disinfectant Products Misc     Unknown  . Verapamil Other (See Comments)    constipation    No results found for this or any previous visit (from the past 2160 hour(s)).  Objective: General: Patient is awake, alert, and oriented x 3 and in no acute distress.  Integument: Skin is warm, dry and supple bilateral. Nails are tender, long, thickened and  dystrophic with subungual debris, consistent with onychomycosis, 1-5 bilateral.  Mild reactive callus at the medial aspect of right second toe.  No signs of infection. No open lesions or preulcerative lesions present bilateral. Remaining integument unremarkable.  Vasculature:  Dorsalis Pedis pulse 1/4 bilateral. Posterior Tibial pulse  1/4 bilateral.  Capillary fill time <  3 sec 1-5 bilateral. Positive hair growth to the level of the digits. Temperature gradient within normal limits. No varicosities present bilateral. No edema present bilateral.   Neurology: The patient has intact sensation measured with a 5.07/10g Semmes Weinstein Monofilament at all pedal sites bilateral . Vibratory sensation diminished bilateral with tuning fork. No Babinski sign present bilateral.   Musculoskeletal: Asymptomatic bunion and hammertoe pedal deformities noted bilateral. Muscular strength 5/5 in all lower extremity muscular groups bilateral without pain on range of motion . No tenderness with calf  compression bilateral.  Assessment and Plan: Problem List Items Addressed This Visit    None    Visit Diagnoses    Pain due to onychomycosis of toenails of both feet    -  Primary   Diabetic polyneuropathy associated with type 2 diabetes mellitus ([place])         -Examined patient. -Discussed and educated patient on diabetic foot care, especially with  regards to the vascular, neurological and musculoskeletal systems.  -Mechanically debrided all nails 1-5 bilateral using sterile nail nipper and filed with dremel without incident  -Continue with toes spacers to prevent rubbing and callus especially at right 2nd toe -Patient to return in 63 days for at risk foot care -Patient advised to call the office if any problems or questions arise in the meantime.  Landis Martins, DPM

## 2018-10-04 DIAGNOSIS — E119 Type 2 diabetes mellitus without complications: Secondary | ICD-10-CM | POA: Diagnosis not present

## 2018-10-08 DIAGNOSIS — E782 Mixed hyperlipidemia: Secondary | ICD-10-CM | POA: Diagnosis not present

## 2018-10-08 DIAGNOSIS — I679 Cerebrovascular disease, unspecified: Secondary | ICD-10-CM | POA: Diagnosis not present

## 2018-10-08 DIAGNOSIS — E1121 Type 2 diabetes mellitus with diabetic nephropathy: Secondary | ICD-10-CM | POA: Diagnosis not present

## 2018-10-08 DIAGNOSIS — Z8679 Personal history of other diseases of the circulatory system: Secondary | ICD-10-CM | POA: Diagnosis not present

## 2018-10-11 DIAGNOSIS — H353211 Exudative age-related macular degeneration, right eye, with active choroidal neovascularization: Secondary | ICD-10-CM | POA: Diagnosis not present

## 2018-10-11 DIAGNOSIS — H353122 Nonexudative age-related macular degeneration, left eye, intermediate dry stage: Secondary | ICD-10-CM | POA: Diagnosis not present

## 2018-11-15 DIAGNOSIS — H353211 Exudative age-related macular degeneration, right eye, with active choroidal neovascularization: Secondary | ICD-10-CM | POA: Diagnosis not present

## 2018-12-12 ENCOUNTER — Encounter: Payer: Self-pay | Admitting: Sports Medicine

## 2018-12-12 ENCOUNTER — Ambulatory Visit (INDEPENDENT_AMBULATORY_CARE_PROVIDER_SITE_OTHER): Payer: Medicare Other | Admitting: Sports Medicine

## 2018-12-12 ENCOUNTER — Other Ambulatory Visit: Payer: Self-pay

## 2018-12-12 VITALS — BP 149/86 | HR 96 | Temp 97.3°F | Resp 16

## 2018-12-12 DIAGNOSIS — E1142 Type 2 diabetes mellitus with diabetic polyneuropathy: Secondary | ICD-10-CM

## 2018-12-12 DIAGNOSIS — M79674 Pain in right toe(s): Secondary | ICD-10-CM

## 2018-12-12 DIAGNOSIS — B351 Tinea unguium: Secondary | ICD-10-CM

## 2018-12-12 DIAGNOSIS — M79675 Pain in left toe(s): Secondary | ICD-10-CM | POA: Diagnosis not present

## 2018-12-12 NOTE — Progress Notes (Signed)
Subjective: Laura Swanson is a 85 y.o. female patient with history of diabetes who presents to office today complaining of long, painful nails  while ambulating in shoes; unable to trim. Patient states that the glucose reading this morning was "good" recorded by facility/assisted living nurse. Denies any new changes in medication but does report that she is still having problems with her eyes/can't see well like before.  Patient is assisted by her daughter at this visit.  Saw PCP Dr. Venetia Maxon in Feb.  Patient Active Problem List   Diagnosis Date Noted  . TIA (transient ischemic attack) 06/02/2015  . Temporary cerebral vascular dysfunction 06/02/2015  . Transient cerebral ischemia 04/28/2015  . Orthostatic hypotension 05/06/2013  . Hypertension 07/23/2012  . Chest pain 07/28/2011  . SVT (supraventricular tachycardia) ([place]) 07/28/2011  . Hyperlipidemia 07/28/2011   Current Outpatient Medications on File Prior to Visit  Medication Sig Dispense Refill  . calcium carbonate (OS-CAL) 600 MG TABS Take 600 mg by mouth daily.      . Cholecalciferol (VITAMIN D3) 2000 UNITS capsule Take 2,000 Units by mouth daily.     . Coenzyme Q10 (COQ-10) 100 MG CAPS Take 200 mg by mouth daily.    Marland Kitchen dipyridamole-aspirin (AGGRENOX) 200-25 MG 12hr capsule Take 1 capsule by mouth daily.    . Lactobacillus (FLORAJEN ACIDOPHILUS) CAPS Take 1 capsule by mouth daily.  99  . metFORMIN (GLUCOPHAGE-XR) 500 MG 24 hr tablet Take 500 mg by mouth 2 (two) times daily.  4  . Multiple Vitamins-Minerals (PRESERVISION AREDS 2 PO) Take 1 capsule by mouth 2 (two) times daily.    . NESINA 25 MG TABS Take 1 tablet by mouth daily.  12  . nitroGLYCERIN (NITROSTAT) 0.4 MG SL tablet PLACE 1 TABLET UNDER TONGUE EVERY 5 MINUTES AS NEEDED FOR CHEST PAIN.    MAX 3 TABLETS 25 tablet 3  . ONE TOUCH ULTRA TEST test strip   3  . polyethylene glycol (MIRALAX / GLYCOLAX) packet Take 17 g by mouth daily.    . rosuvastatin (CRESTOR) 10 MG tablet Take 10  mg by mouth as directed. 1 tablet on Friday only    . vitamin C (ASCORBIC ACID) 500 MG tablet Take 1,000 mg by mouth 2 (two) times daily.     No current facility-administered medications on file prior to visit.    Allergies  Allergen Reactions  . Iodine Hives  . Repaglinide Other (See Comments)    Drop blood glucose and became very dizzy with near syncope   . Lipitor [Atorvastatin] Other (See Comments)    cramps  . Rosuvastatin Calcium Other (See Comments)    Cramps- tolerating taking once weekly Cramps  . Statins Other (See Comments)    Cramps   . Antiseptic Products, Misc.     Unknown  . Disinfectant Products Misc     Unknown  . Verapamil Other (See Comments)    constipation    No results found for this or any previous visit (from the past 2160 hour(s)).  Objective: General: Patient is awake, alert, and oriented x 3 and in no acute distress.  Integument: Skin is warm, dry and supple bilateral. Nails are tender, long, thickened and  dystrophic with subungual debris, consistent with onychomycosis, 1-5 bilateral.  Mild reactive callus at the medial aspect of right second toe.  No signs of infection. No open lesions or preulcerative lesions present bilateral. Remaining integument unremarkable.  Vasculature:  Dorsalis Pedis pulse 1/4 bilateral. Posterior Tibial pulse  1/4 bilateral.  Capillary  fill time <3 sec 1-5 bilateral. Positive scant hair growth to the level of the digits. Temperature gradient within normal limits. ++ varicosities present bilateral. No edema present bilateral.   Neurology: The patient has intact sensation measured with a 5.07/10g Semmes Weinstein Monofilament at all pedal sites bilateral . Vibratory sensation diminished bilateral with tuning fork. No Babinski sign present bilateral.   Musculoskeletal: Asymptomatic bunion and hammertoe pedal deformities noted bilateral. Muscular strength 5/5 in all lower extremity muscular groups bilateral without pain on  range of motion . No tenderness with calf compression bilateral.  Assessment and Plan: Problem List Items Addressed This Visit    None    Visit Diagnoses    Pain due to onychomycosis of toenails of both feet    -  Primary   Diabetic polyneuropathy associated with type 2 diabetes mellitus ([place])         -Examined patient. -Discussed and educated patient on diabetic foot care, especially with  regards to the vascular, neurological and musculoskeletal systems.  -Mechanically debrided all nails 1-5 bilateral using sterile nail nipper and filed with dremel without incident  -Continue with toes spacers to prevent rubbing and callus especially at right 2nd toe like before. Today I smoothed the callus with dremel without incident at no charge -Patient to return in 63 days for at risk foot care -Patient advised to call the office if any problems or questions arise in the meantime.  Landis Martins, DPM

## 2019-01-11 DIAGNOSIS — E1121 Type 2 diabetes mellitus with diabetic nephropathy: Secondary | ICD-10-CM | POA: Diagnosis not present

## 2019-01-11 DIAGNOSIS — H353211 Exudative age-related macular degeneration, right eye, with active choroidal neovascularization: Secondary | ICD-10-CM | POA: Diagnosis not present

## 2019-01-18 DIAGNOSIS — E1121 Type 2 diabetes mellitus with diabetic nephropathy: Secondary | ICD-10-CM | POA: Diagnosis not present

## 2019-01-18 DIAGNOSIS — H353290 Exudative age-related macular degeneration, unspecified eye, stage unspecified: Secondary | ICD-10-CM | POA: Diagnosis not present

## 2019-01-18 DIAGNOSIS — M159 Polyosteoarthritis, unspecified: Secondary | ICD-10-CM | POA: Diagnosis not present

## 2019-01-18 DIAGNOSIS — E782 Mixed hyperlipidemia: Secondary | ICD-10-CM | POA: Diagnosis not present

## 2019-02-13 ENCOUNTER — Ambulatory Visit: Payer: Medicare Other | Admitting: Sports Medicine

## 2019-02-15 ENCOUNTER — Encounter: Payer: Self-pay | Admitting: Sports Medicine

## 2019-02-15 ENCOUNTER — Other Ambulatory Visit: Payer: Self-pay

## 2019-02-15 ENCOUNTER — Ambulatory Visit (INDEPENDENT_AMBULATORY_CARE_PROVIDER_SITE_OTHER): Payer: Medicare Other | Admitting: Sports Medicine

## 2019-02-15 VITALS — Temp 98.3°F | Resp 16

## 2019-02-15 DIAGNOSIS — E1142 Type 2 diabetes mellitus with diabetic polyneuropathy: Secondary | ICD-10-CM

## 2019-02-15 DIAGNOSIS — M79675 Pain in left toe(s): Secondary | ICD-10-CM

## 2019-02-15 DIAGNOSIS — M79674 Pain in right toe(s): Secondary | ICD-10-CM

## 2019-02-15 DIAGNOSIS — B351 Tinea unguium: Secondary | ICD-10-CM | POA: Diagnosis not present

## 2019-02-15 DIAGNOSIS — I739 Peripheral vascular disease, unspecified: Secondary | ICD-10-CM

## 2019-02-15 NOTE — Progress Notes (Signed)
Subjective: Laura Swanson is a 85 y.o. female patient with history of diabetes who returns to office today complaining of long, painful nails  while ambulating in shoes; unable to trim. Patient states that the glucose reading this morning was "good" recorded by facility/assisted living nurse. Denies any new changes in medication or any other acute concerns.  Patient is assisted by daughter this visit.  Saw PCP Dr. Venetia Maxon a few weeks ago and will see again in August  Patient Active Problem List   Diagnosis Date Noted  . TIA (transient ischemic attack) 06/02/2015  . Temporary cerebral vascular dysfunction 06/02/2015  . Transient cerebral ischemia 04/28/2015  . Orthostatic hypotension 05/06/2013  . Hypertension 07/23/2012  . Chest pain 07/28/2011  . SVT (supraventricular tachycardia) ([place]) 07/28/2011  . Hyperlipidemia 07/28/2011   Current Outpatient Medications on File Prior to Visit  Medication Sig Dispense Refill  . calcium carbonate (OS-CAL) 600 MG TABS Take 600 mg by mouth daily.      . Cholecalciferol (VITAMIN D3) 2000 UNITS capsule Take 2,000 Units by mouth daily.     . Coenzyme Q10 (COQ-10) 100 MG CAPS Take 200 mg by mouth daily.    Marland Kitchen dipyridamole-aspirin (AGGRENOX) 200-25 MG 12hr capsule Take 1 capsule by mouth daily.    . Lactobacillus (FLORAJEN ACIDOPHILUS) CAPS Take 1 capsule by mouth daily.  99  . metFORMIN (GLUCOPHAGE-XR) 500 MG 24 hr tablet Take 500 mg by mouth 2 (two) times daily.  4  . Multiple Vitamins-Minerals (PRESERVISION AREDS 2 PO) Take 1 capsule by mouth 2 (two) times daily.    . NESINA 25 MG TABS Take 1 tablet by mouth daily.  12  . nitroGLYCERIN (NITROSTAT) 0.4 MG SL tablet PLACE 1 TABLET UNDER TONGUE EVERY 5 MINUTES AS NEEDED FOR CHEST PAIN.    MAX 3 TABLETS 25 tablet 3  . ONE TOUCH ULTRA TEST test strip   3  . polyethylene glycol (MIRALAX / GLYCOLAX) packet Take 17 g by mouth daily.    . rosuvastatin (CRESTOR) 10 MG tablet Take 10 mg by mouth as directed. 1 tablet  on Friday only    . vitamin C (ASCORBIC ACID) 500 MG tablet Take 1,000 mg by mouth 2 (two) times daily.     No current facility-administered medications on file prior to visit.    Allergies  Allergen Reactions  . Iodine Hives  . Repaglinide Other (See Comments)    Drop blood glucose and became very dizzy with near syncope   . Lipitor [Atorvastatin] Other (See Comments)    cramps  . Rosuvastatin Calcium Other (See Comments)    Cramps- tolerating taking once weekly Cramps  . Statins Other (See Comments)    Cramps   . Antiseptic Products, Misc.     Unknown  . Disinfectant Products Misc     Unknown  . Verapamil Other (See Comments)    constipation    No results found for this or any previous visit (from the past 2160 hour(s)).  Objective: General: Patient is awake, alert, and oriented x 3 and in no acute distress.  Integument: Skin is warm, dry and supple bilateral. Nails are tender, long, thickened and  dystrophic with subungual debris, consistent with onychomycosis, 1-5 bilateral.  Mild reactive callus at the medial aspect of right second toe.  No signs of infection. No open lesions or preulcerative lesions present bilateral. Remaining integument unremarkable.  Vasculature:  Dorsalis Pedis pulse 1/4 bilateral. Posterior Tibial pulse  1/4 bilateral.  Capillary fill time <3 sec 1-5  bilateral. Positive scant hair growth to the level of the digits. Temperature gradient within normal limits. ++ varicosities present bilateral. No edema present bilateral.   Neurology: The patient has intact sensation measured with a 5.07/10g Semmes Weinstein Monofilament at all pedal sites bilateral . Vibratory sensation diminished bilateral with tuning fork. No Babinski sign present bilateral.   Musculoskeletal: Asymptomatic bunion and hammertoe pedal deformities noted bilateral. Muscular strength 5/5 in all lower extremity muscular groups bilateral without pain on range of motion . No tenderness  with calf compression bilateral.  Assessment and Plan: Problem List Items Addressed This Visit    None    Visit Diagnoses    Pain due to onychomycosis of toenails of both feet    -  Primary   Diabetic polyneuropathy associated with type 2 diabetes mellitus ([facility])       PVD (peripheral vascular disease) ([place])         -Examined patient. -Discussed and educated patient on diabetic foot care, especially with  regards to the vascular, neurological and musculoskeletal systems.  -Mechanically debrided all nails 1-5 bilateral using sterile nail nipper and filed with dremel without incident  -Continue with toe spacers to prevent rubbing of callus at right second toe which was very minimal today and did not need to be smoothed with dremel at today's visit -Patient to return in 63 days for at risk foot care -Patient advised to call the office if any problems or questions arise in the meantime.  Landis Martins, DPM

## 2019-03-15 DIAGNOSIS — H353211 Exudative age-related macular degeneration, right eye, with active choroidal neovascularization: Secondary | ICD-10-CM | POA: Diagnosis not present

## 2019-03-15 DIAGNOSIS — H353122 Nonexudative age-related macular degeneration, left eye, intermediate dry stage: Secondary | ICD-10-CM | POA: Diagnosis not present

## 2019-04-05 DIAGNOSIS — Z6822 Body mass index (BMI) 22.0-22.9, adult: Secondary | ICD-10-CM | POA: Diagnosis not present

## 2019-04-05 DIAGNOSIS — H6121 Impacted cerumen, right ear: Secondary | ICD-10-CM | POA: Diagnosis not present

## 2019-04-05 DIAGNOSIS — E1121 Type 2 diabetes mellitus with diabetic nephropathy: Secondary | ICD-10-CM | POA: Diagnosis not present

## 2019-04-05 DIAGNOSIS — Z1331 Encounter for screening for depression: Secondary | ICD-10-CM | POA: Diagnosis not present

## 2019-04-05 DIAGNOSIS — Z9181 History of falling: Secondary | ICD-10-CM | POA: Diagnosis not present

## 2019-04-08 DIAGNOSIS — H401131 Primary open-angle glaucoma, bilateral, mild stage: Secondary | ICD-10-CM | POA: Diagnosis not present

## 2019-04-19 ENCOUNTER — Encounter: Payer: Self-pay | Admitting: Sports Medicine

## 2019-04-19 ENCOUNTER — Ambulatory Visit (INDEPENDENT_AMBULATORY_CARE_PROVIDER_SITE_OTHER): Payer: Medicare Other | Admitting: Sports Medicine

## 2019-04-19 ENCOUNTER — Other Ambulatory Visit: Payer: Self-pay

## 2019-04-19 VITALS — Temp 98.8°F | Resp 16

## 2019-04-19 DIAGNOSIS — I739 Peripheral vascular disease, unspecified: Secondary | ICD-10-CM

## 2019-04-19 DIAGNOSIS — M79674 Pain in right toe(s): Secondary | ICD-10-CM | POA: Diagnosis not present

## 2019-04-19 DIAGNOSIS — M79675 Pain in left toe(s): Secondary | ICD-10-CM

## 2019-04-19 DIAGNOSIS — E1142 Type 2 diabetes mellitus with diabetic polyneuropathy: Secondary | ICD-10-CM | POA: Diagnosis not present

## 2019-04-19 DIAGNOSIS — B351 Tinea unguium: Secondary | ICD-10-CM

## 2019-04-19 NOTE — Progress Notes (Signed)
Subjective: Laura Swanson is a 85 y.o. female patient with history of diabetes who returns to office today complaining of long, painful nails  while ambulating in shoes; unable to trim. Patient states that the glucose reading this morning was "good" recorded by facility/assisted living nurse at [facility]. Denies any new changes in medication or any other acute concerns but does admit that she was bitten by chiggers a few weeks ago and states that the areas are doing better.  Patient is assisted by daughter this visit.  Saw PCP, Dr. Venetia Maxon  2 weeks ago.   Patient Active Problem List   Diagnosis Date Noted  . TIA (transient ischemic attack) 06/02/2015  . Temporary cerebral vascular dysfunction 06/02/2015  . Transient cerebral ischemia 04/28/2015  . Orthostatic hypotension 05/06/2013  . Hypertension 07/23/2012  . Chest pain 07/28/2011  . SVT (supraventricular tachycardia) ([place]) 07/28/2011  . Hyperlipidemia 07/28/2011   Current Outpatient Medications on File Prior to Visit  Medication Sig Dispense Refill  . calcium carbonate (OS-CAL) 600 MG TABS Take 600 mg by mouth daily.      . Cholecalciferol (VITAMIN D3) 2000 UNITS capsule Take 2,000 Units by mouth daily.     . Coenzyme Q10 (COQ-10) 100 MG CAPS Take 200 mg by mouth daily.    Marland Kitchen dipyridamole-aspirin (AGGRENOX) 200-25 MG 12hr capsule Take 1 capsule by mouth daily.    . Lactobacillus (FLORAJEN ACIDOPHILUS) CAPS Take 1 capsule by mouth daily.  99  . metFORMIN (GLUCOPHAGE-XR) 500 MG 24 hr tablet Take 500 mg by mouth 2 (two) times daily.  4  . Multiple Vitamins-Minerals (PRESERVISION AREDS 2 PO) Take 1 capsule by mouth 2 (two) times daily.    . NESINA 25 MG TABS Take 1 tablet by mouth daily.  12  . nitroGLYCERIN (NITROSTAT) 0.4 MG SL tablet PLACE 1 TABLET UNDER TONGUE EVERY 5 MINUTES AS NEEDED FOR CHEST PAIN.    MAX 3 TABLETS 25 tablet 3  . ONE TOUCH ULTRA TEST test strip   3  . polyethylene glycol (MIRALAX / GLYCOLAX) packet Take 17 g by mouth  daily.    . rosuvastatin (CRESTOR) 10 MG tablet Take 10 mg by mouth as directed. 1 tablet on Friday only    . vitamin C (ASCORBIC ACID) 500 MG tablet Take 1,000 mg by mouth 2 (two) times daily.     No current facility-administered medications on file prior to visit.    Allergies  Allergen Reactions  . Iodine Hives  . Repaglinide Other (See Comments)    Drop blood glucose and became very dizzy with near syncope   . Lipitor [Atorvastatin] Other (See Comments)    cramps  . Rosuvastatin Calcium Other (See Comments)    Cramps- tolerating taking once weekly Cramps  . Statins Other (See Comments)    Cramps   . Antiseptic Products, Misc.     Unknown  . Disinfectant Products Misc     Unknown  . Verapamil Other (See Comments)    constipation    No results found for this or any previous visit (from the past 2160 hour(s)).  Objective: General: Patient is awake, alert, and oriented x 3 and in no acute distress.  Integument: Skin is warm, dry and supple bilateral. Nails are tender, long, thickened and dystrophic with subungual debris, consistent with onychomycosis, 1-5 bilateral.  Mild reactive callus at the medial aspect of right second toe.  No signs of infection. No open lesions or preulcerative lesions present bilateral. Remaining integument unremarkable.  Vasculature:  Dorsalis  Pedis pulse 1/4 bilateral. Posterior Tibial pulse  1/4 bilateral. Capillary fill time <3 sec 1-5 bilateral. Positive scant hair growth to the level of the digits.Temperature gradient within normal limits. ++ varicosities present bilateral.  Neurology: The patient has intact sensation measured with a 5.07/10g Semmes Weinstein Monofilament at all pedal sites bilateral . Vibratory sensation diminished bilateral with tuning fork. No Babinski sign present bilateral.   Musculoskeletal: Asymptomatic bunion and hammertoe pedal deformities noted bilateral. Muscular strength 5/5 in all lower extremity muscular groups  bilateral without pain on range of motion . No tenderness with calf compression bilateral.  Assessment and Plan: Problem List Items Addressed This Visit    None    Visit Diagnoses    Pain due to onychomycosis of toenails of both feet    -  Primary   Diabetic polyneuropathy associated with type 2 diabetes mellitus ([facility])       PVD (peripheral vascular disease) ([place])         -Examined patient. -Encouraged daily inspection of feet in the setting of diabetes -Mechanically debrided all nails 1-5 bilateral using sterile nail nipper and filed with dremel without incident  -Continue with toe spacers to prevent rubbing of callus at right second toe which was very minimal today and did not need to be smoothed with dremel at today's visit like before -Patient to return in 2.5 months for at risk foot care -Patient advised to call the office if any problems or questions arise in the meantime.  Landis Martins, DPM

## 2019-04-24 DIAGNOSIS — E782 Mixed hyperlipidemia: Secondary | ICD-10-CM | POA: Diagnosis not present

## 2019-04-24 DIAGNOSIS — E1121 Type 2 diabetes mellitus with diabetic nephropathy: Secondary | ICD-10-CM | POA: Diagnosis not present

## 2019-04-29 DIAGNOSIS — E782 Mixed hyperlipidemia: Secondary | ICD-10-CM | POA: Diagnosis not present

## 2019-04-29 DIAGNOSIS — Z111 Encounter for screening for respiratory tuberculosis: Secondary | ICD-10-CM | POA: Diagnosis not present

## 2019-04-29 DIAGNOSIS — Z8673 Personal history of transient ischemic attack (TIA), and cerebral infarction without residual deficits: Secondary | ICD-10-CM | POA: Diagnosis not present

## 2019-04-29 DIAGNOSIS — E1121 Type 2 diabetes mellitus with diabetic nephropathy: Secondary | ICD-10-CM | POA: Diagnosis not present

## 2019-04-29 DIAGNOSIS — I679 Cerebrovascular disease, unspecified: Secondary | ICD-10-CM | POA: Diagnosis not present

## 2019-04-29 DIAGNOSIS — Z23 Encounter for immunization: Secondary | ICD-10-CM | POA: Diagnosis not present

## 2019-04-29 DIAGNOSIS — Z0000 Encounter for general adult medical examination without abnormal findings: Secondary | ICD-10-CM | POA: Diagnosis not present

## 2019-05-21 DIAGNOSIS — R278 Other lack of coordination: Secondary | ICD-10-CM | POA: Diagnosis not present

## 2019-05-21 DIAGNOSIS — R296 Repeated falls: Secondary | ICD-10-CM | POA: Diagnosis not present

## 2019-05-21 DIAGNOSIS — R4181 Age-related cognitive decline: Secondary | ICD-10-CM | POA: Diagnosis not present

## 2019-05-22 DIAGNOSIS — B351 Tinea unguium: Secondary | ICD-10-CM | POA: Diagnosis not present

## 2019-05-22 DIAGNOSIS — M79675 Pain in left toe(s): Secondary | ICD-10-CM | POA: Diagnosis not present

## 2019-05-22 DIAGNOSIS — M2041 Other hammer toe(s) (acquired), right foot: Secondary | ICD-10-CM | POA: Diagnosis not present

## 2019-05-22 DIAGNOSIS — E1151 Type 2 diabetes mellitus with diabetic peripheral angiopathy without gangrene: Secondary | ICD-10-CM | POA: Diagnosis not present

## 2019-05-22 DIAGNOSIS — S9031XA Contusion of right foot, initial encounter: Secondary | ICD-10-CM | POA: Diagnosis not present

## 2019-05-23 DIAGNOSIS — R4181 Age-related cognitive decline: Secondary | ICD-10-CM | POA: Diagnosis not present

## 2019-05-23 DIAGNOSIS — R296 Repeated falls: Secondary | ICD-10-CM | POA: Diagnosis not present

## 2019-05-23 DIAGNOSIS — R278 Other lack of coordination: Secondary | ICD-10-CM | POA: Diagnosis not present

## 2019-05-26 DIAGNOSIS — I10 Essential (primary) hypertension: Secondary | ICD-10-CM | POA: Diagnosis not present

## 2019-05-28 DIAGNOSIS — R278 Other lack of coordination: Secondary | ICD-10-CM | POA: Diagnosis not present

## 2019-05-28 DIAGNOSIS — D508 Other iron deficiency anemias: Secondary | ICD-10-CM | POA: Diagnosis not present

## 2019-05-28 DIAGNOSIS — R296 Repeated falls: Secondary | ICD-10-CM | POA: Diagnosis not present

## 2019-05-28 DIAGNOSIS — D519 Vitamin B12 deficiency anemia, unspecified: Secondary | ICD-10-CM | POA: Diagnosis not present

## 2019-05-28 DIAGNOSIS — E039 Hypothyroidism, unspecified: Secondary | ICD-10-CM | POA: Diagnosis not present

## 2019-05-28 DIAGNOSIS — D649 Anemia, unspecified: Secondary | ICD-10-CM | POA: Diagnosis not present

## 2019-05-28 DIAGNOSIS — E119 Type 2 diabetes mellitus without complications: Secondary | ICD-10-CM | POA: Diagnosis not present

## 2019-05-28 DIAGNOSIS — R4181 Age-related cognitive decline: Secondary | ICD-10-CM | POA: Diagnosis not present

## 2019-05-28 DIAGNOSIS — E08311 Diabetes mellitus due to underlying condition with unspecified diabetic retinopathy with macular edema: Secondary | ICD-10-CM | POA: Diagnosis not present

## 2019-05-28 DIAGNOSIS — E876 Hypokalemia: Secondary | ICD-10-CM | POA: Diagnosis not present

## 2019-05-29 DIAGNOSIS — H353212 Exudative age-related macular degeneration, right eye, with inactive choroidal neovascularization: Secondary | ICD-10-CM | POA: Diagnosis not present

## 2019-05-31 DIAGNOSIS — R4181 Age-related cognitive decline: Secondary | ICD-10-CM | POA: Diagnosis not present

## 2019-05-31 DIAGNOSIS — R296 Repeated falls: Secondary | ICD-10-CM | POA: Diagnosis not present

## 2019-05-31 DIAGNOSIS — R278 Other lack of coordination: Secondary | ICD-10-CM | POA: Diagnosis not present

## 2019-06-04 DIAGNOSIS — E08311 Diabetes mellitus due to underlying condition with unspecified diabetic retinopathy with macular edema: Secondary | ICD-10-CM | POA: Diagnosis not present

## 2019-06-04 DIAGNOSIS — E785 Hyperlipidemia, unspecified: Secondary | ICD-10-CM | POA: Diagnosis not present

## 2019-06-06 DIAGNOSIS — R4181 Age-related cognitive decline: Secondary | ICD-10-CM | POA: Diagnosis not present

## 2019-06-06 DIAGNOSIS — R296 Repeated falls: Secondary | ICD-10-CM | POA: Diagnosis not present

## 2019-06-06 DIAGNOSIS — R278 Other lack of coordination: Secondary | ICD-10-CM | POA: Diagnosis not present

## 2019-06-07 DIAGNOSIS — R278 Other lack of coordination: Secondary | ICD-10-CM | POA: Diagnosis not present

## 2019-06-07 DIAGNOSIS — R4181 Age-related cognitive decline: Secondary | ICD-10-CM | POA: Diagnosis not present

## 2019-06-07 DIAGNOSIS — R296 Repeated falls: Secondary | ICD-10-CM | POA: Diagnosis not present

## 2019-06-11 DIAGNOSIS — R278 Other lack of coordination: Secondary | ICD-10-CM | POA: Diagnosis not present

## 2019-06-11 DIAGNOSIS — R296 Repeated falls: Secondary | ICD-10-CM | POA: Diagnosis not present

## 2019-06-11 DIAGNOSIS — R4181 Age-related cognitive decline: Secondary | ICD-10-CM | POA: Diagnosis not present

## 2019-06-13 DIAGNOSIS — R296 Repeated falls: Secondary | ICD-10-CM | POA: Diagnosis not present

## 2019-06-13 DIAGNOSIS — R4181 Age-related cognitive decline: Secondary | ICD-10-CM | POA: Diagnosis not present

## 2019-06-13 DIAGNOSIS — R278 Other lack of coordination: Secondary | ICD-10-CM | POA: Diagnosis not present

## 2019-06-18 DIAGNOSIS — R278 Other lack of coordination: Secondary | ICD-10-CM | POA: Diagnosis not present

## 2019-06-18 DIAGNOSIS — R4181 Age-related cognitive decline: Secondary | ICD-10-CM | POA: Diagnosis not present

## 2019-06-18 DIAGNOSIS — R296 Repeated falls: Secondary | ICD-10-CM | POA: Diagnosis not present

## 2019-06-20 DIAGNOSIS — R4181 Age-related cognitive decline: Secondary | ICD-10-CM | POA: Diagnosis not present

## 2019-06-20 DIAGNOSIS — R296 Repeated falls: Secondary | ICD-10-CM | POA: Diagnosis not present

## 2019-06-20 DIAGNOSIS — R350 Frequency of micturition: Secondary | ICD-10-CM | POA: Diagnosis not present

## 2019-06-20 DIAGNOSIS — R278 Other lack of coordination: Secondary | ICD-10-CM | POA: Diagnosis not present

## 2019-06-20 DIAGNOSIS — R3915 Urgency of urination: Secondary | ICD-10-CM | POA: Diagnosis not present

## 2019-06-21 ENCOUNTER — Ambulatory Visit: Payer: Medicare Other | Admitting: Sports Medicine

## 2019-06-25 DIAGNOSIS — R278 Other lack of coordination: Secondary | ICD-10-CM | POA: Diagnosis not present

## 2019-06-25 DIAGNOSIS — R296 Repeated falls: Secondary | ICD-10-CM | POA: Diagnosis not present

## 2019-06-25 DIAGNOSIS — D649 Anemia, unspecified: Secondary | ICD-10-CM | POA: Diagnosis not present

## 2019-06-25 DIAGNOSIS — R4181 Age-related cognitive decline: Secondary | ICD-10-CM | POA: Diagnosis not present

## 2019-06-25 DIAGNOSIS — E08311 Diabetes mellitus due to underlying condition with unspecified diabetic retinopathy with macular edema: Secondary | ICD-10-CM | POA: Diagnosis not present

## 2019-06-26 DIAGNOSIS — F0390 Unspecified dementia without behavioral disturbance: Secondary | ICD-10-CM | POA: Diagnosis not present

## 2019-06-26 DIAGNOSIS — E119 Type 2 diabetes mellitus without complications: Secondary | ICD-10-CM | POA: Diagnosis not present

## 2019-06-26 DIAGNOSIS — R1312 Dysphagia, oropharyngeal phase: Secondary | ICD-10-CM | POA: Diagnosis not present

## 2019-06-26 DIAGNOSIS — R4181 Age-related cognitive decline: Secondary | ICD-10-CM | POA: Diagnosis not present

## 2019-06-27 DIAGNOSIS — R296 Repeated falls: Secondary | ICD-10-CM | POA: Diagnosis not present

## 2019-06-27 DIAGNOSIS — R4181 Age-related cognitive decline: Secondary | ICD-10-CM | POA: Diagnosis not present

## 2019-06-27 DIAGNOSIS — R278 Other lack of coordination: Secondary | ICD-10-CM | POA: Diagnosis not present

## 2019-06-28 ENCOUNTER — Emergency Department (HOSPITAL_COMMUNITY): Payer: Medicare Other

## 2019-06-28 ENCOUNTER — Other Ambulatory Visit: Payer: Self-pay

## 2019-06-28 ENCOUNTER — Emergency Department (HOSPITAL_COMMUNITY)
Admission: EM | Admit: 2019-06-28 | Discharge: 2019-06-28 | Disposition: A | Discharge status: Home / Self Care | Payer: Medicare Other | Attending: Emergency Medicine | Admitting: Emergency Medicine

## 2019-06-28 ENCOUNTER — Encounter (HOSPITAL_COMMUNITY): Payer: Self-pay | Admitting: Emergency Medicine

## 2019-06-28 DIAGNOSIS — R52 Pain, unspecified: Secondary | ICD-10-CM | POA: Diagnosis not present

## 2019-06-28 DIAGNOSIS — Y929 Unspecified place or not applicable: Secondary | ICD-10-CM | POA: Diagnosis not present

## 2019-06-28 DIAGNOSIS — Y9301 Activity, walking, marching and hiking: Secondary | ICD-10-CM | POA: Insufficient documentation

## 2019-06-28 DIAGNOSIS — R296 Repeated falls: Secondary | ICD-10-CM | POA: Diagnosis not present

## 2019-06-28 DIAGNOSIS — H81399 Other peripheral vertigo, unspecified ear: Secondary | ICD-10-CM | POA: Diagnosis not present

## 2019-06-28 DIAGNOSIS — Y999 Unspecified external cause status: Secondary | ICD-10-CM | POA: Diagnosis not present

## 2019-06-28 DIAGNOSIS — Z7984 Long term (current) use of oral hypoglycemic drugs: Secondary | ICD-10-CM | POA: Diagnosis not present

## 2019-06-28 DIAGNOSIS — I10 Essential (primary) hypertension: Secondary | ICD-10-CM | POA: Insufficient documentation

## 2019-06-28 DIAGNOSIS — W19XXXA Unspecified fall, initial encounter: Secondary | ICD-10-CM

## 2019-06-28 DIAGNOSIS — W1830XA Fall on same level, unspecified, initial encounter: Secondary | ICD-10-CM | POA: Diagnosis not present

## 2019-06-28 DIAGNOSIS — E119 Type 2 diabetes mellitus without complications: Secondary | ICD-10-CM | POA: Insufficient documentation

## 2019-06-28 DIAGNOSIS — S0990XA Unspecified injury of head, initial encounter: Secondary | ICD-10-CM | POA: Diagnosis not present

## 2019-06-28 DIAGNOSIS — M25551 Pain in right hip: Secondary | ICD-10-CM | POA: Diagnosis not present

## 2019-06-28 DIAGNOSIS — R404 Transient alteration of awareness: Secondary | ICD-10-CM | POA: Diagnosis not present

## 2019-06-28 DIAGNOSIS — Z79899 Other long term (current) drug therapy: Secondary | ICD-10-CM | POA: Insufficient documentation

## 2019-06-28 DIAGNOSIS — R42 Dizziness and giddiness: Secondary | ICD-10-CM | POA: Diagnosis not present

## 2019-06-28 DIAGNOSIS — I4891 Unspecified atrial fibrillation: Secondary | ICD-10-CM | POA: Diagnosis not present

## 2019-06-28 DIAGNOSIS — S199XXA Unspecified injury of neck, initial encounter: Secondary | ICD-10-CM | POA: Diagnosis not present

## 2019-06-28 LAB — BASIC METABOLIC PANEL
Anion gap: 14 (ref 5–15)
BUN: 18 mg/dL (ref 8–23)
CO2: 23 mmol/L (ref 22–32)
Calcium: 8.7 mg/dL — ABNORMAL LOW (ref 8.9–10.3)
Chloride: 98 mmol/L (ref 98–111)
Creatinine, Ser: 0.9 mg/dL (ref 0.44–1.00)
GFR calc Af Amer: >60 mL/min (ref >60)
GFR calc non Af Amer: 56 mL/min — ABNORMAL LOW (ref >60)
Glucose, Bld: 135 mg/dL — ABNORMAL HIGH (ref 70–99)
Potassium: 3.9 mmol/L (ref 3.5–5.1)
Sodium: 135 mmol/L (ref 135–145)

## 2019-06-28 LAB — CBC WITH DIFFERENTIAL/PLATELET
Abs Immature Granulocytes: 0.03 10*3/uL (ref 0.00–0.07)
Basophils Absolute: 0 10*3/uL (ref 0.0–0.1)
Basophils Relative: 0 %
Eosinophils Absolute: 0.1 10*3/uL (ref 0.0–0.5)
Eosinophils Relative: 1 %
HCT: 39.9 % (ref 36.0–46.0)
Hemoglobin: 13.4 g/dL (ref 12.0–15.0)
Immature Granulocytes: 0 %
Lymphocytes Relative: 32 %
Lymphs Abs: 2.7 10*3/uL (ref 0.7–4.0)
MCH: 31.5 pg (ref 26.0–34.0)
MCHC: 33.6 g/dL (ref 30.0–36.0)
MCV: 93.7 fL (ref 80.0–100.0)
Monocytes Absolute: 0.6 10*3/uL (ref 0.1–1.0)
Monocytes Relative: 7 %
Neutro Abs: 4.8 10*3/uL (ref 1.7–7.7)
Neutrophils Relative %: 60 %
Platelets: 311 10*3/uL (ref 150–400)
RBC: 4.26 MIL/uL (ref 3.87–5.11)
RDW: 13.9 % (ref 11.5–15.5)
WBC: 8.3 10*3/uL (ref 4.0–10.5)
nRBC: 0 % (ref 0.0–0.2)

## 2019-06-28 MED ORDER — MECLIZINE HCL 25 MG PO TABS
25.0000 mg | ORAL_TABLET | Freq: Once | ORAL | Status: AC
  Administered 2019-06-28: 25 mg via ORAL
  Filled 2019-06-28: qty 1

## 2019-06-28 MED ORDER — BACITRACIN ZINC 500 UNIT/GM EX OINT: 1.0000 "application " | TOPICAL_OINTMENT | Freq: Two times a day (BID) | CUTANEOUS | 0 refills | Status: AC

## 2019-06-28 MED ORDER — MECLIZINE HCL 25 MG PO TABS: 25.0000 mg | ORAL_TABLET | Freq: Three times a day (TID) | ORAL | 0 refills | Status: DC | PRN

## 2019-06-28 MED ORDER — BACITRACIN ZINC 500 UNIT/GM EX OINT: 1.0000 "application " | TOPICAL_OINTMENT | Freq: Two times a day (BID) | CUTANEOUS | 0 refills | Status: DC

## 2019-06-28 MED ORDER — BACITRACIN ZINC 500 UNIT/GM EX OINT
TOPICAL_OINTMENT | Freq: Two times a day (BID) | CUTANEOUS | Status: DC
  Administered 2019-06-28: 22:00:00 via TOPICAL

## 2019-06-28 MED ORDER — IOHEXOL 350 MG/ML SOLN
100.0000 mL | Freq: Once | INTRAVENOUS | Status: AC | PRN
  Administered 2019-06-28: 100 mL via INTRAVENOUS

## 2019-06-28 NOTE — Discharge Instructions (Addendum)
We saw in the ER after he had a fall. It appears that you are having vertigo that is leading to the fall.  Our work-up in the ER reveals no brain bleed, skull fracture, neck fracture, and no severe blockages of your carotid blood vessels.  We recommend that you take meclizine to see if we can prevent spinning sensation.  Furosemide, one of the medications you might have been prescribed recently can cause spinning sensation.  If you are still taking that medication please ask your doctor if it still necessary and if they think it is contributing to your spinning sensation.  Please follow-up with neurologist in 1 to 2 weeks.  Keep the wound in the leg clean and dry.  Apply bacitracin twice a day.

## 2019-06-28 NOTE — ED Triage Notes (Addendum)
Pt BIB EMS for witnessed fall, pt reports being dizzy before falling. Per witnesses, pt fell to the right, hit head on railing in hallway, no LOC. Pt reports mid back pain, R head pain, R hip pain, and R and L ankle pain. Pt is not on blood thinners and has had 3 falls recently. Pt is AOx3 at baseline, disoriented to time.
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
## 2019-07-01 DIAGNOSIS — E119 Type 2 diabetes mellitus without complications: Secondary | ICD-10-CM | POA: Diagnosis not present

## 2019-07-01 DIAGNOSIS — R4181 Age-related cognitive decline: Secondary | ICD-10-CM | POA: Diagnosis not present

## 2019-07-01 DIAGNOSIS — R1312 Dysphagia, oropharyngeal phase: Secondary | ICD-10-CM | POA: Diagnosis not present

## 2019-07-01 DIAGNOSIS — F0390 Unspecified dementia without behavioral disturbance: Secondary | ICD-10-CM | POA: Diagnosis not present

## 2019-07-02 DIAGNOSIS — D649 Anemia, unspecified: Secondary | ICD-10-CM | POA: Diagnosis not present

## 2019-07-02 DIAGNOSIS — E08311 Diabetes mellitus due to underlying condition with unspecified diabetic retinopathy with macular edema: Secondary | ICD-10-CM | POA: Diagnosis not present

## 2019-07-03 DIAGNOSIS — F0390 Unspecified dementia without behavioral disturbance: Secondary | ICD-10-CM | POA: Diagnosis not present

## 2019-07-03 DIAGNOSIS — R1312 Dysphagia, oropharyngeal phase: Secondary | ICD-10-CM | POA: Diagnosis not present

## 2019-07-03 DIAGNOSIS — E119 Type 2 diabetes mellitus without complications: Secondary | ICD-10-CM | POA: Diagnosis not present

## 2019-07-03 DIAGNOSIS — R4181 Age-related cognitive decline: Secondary | ICD-10-CM | POA: Diagnosis not present

## 2019-07-04 ENCOUNTER — Other Ambulatory Visit: Payer: Self-pay

## 2019-07-04 ENCOUNTER — Ambulatory Visit (INDEPENDENT_AMBULATORY_CARE_PROVIDER_SITE_OTHER): Payer: Medicare Other | Admitting: Neurology

## 2019-07-04 ENCOUNTER — Encounter: Payer: Self-pay | Admitting: Neurology

## 2019-07-04 VITALS — BP 113/79 | HR 89 | Temp 97.7°F | Ht 64.0 in | Wt 125.5 lb

## 2019-07-04 DIAGNOSIS — R42 Dizziness and giddiness: Secondary | ICD-10-CM | POA: Diagnosis not present

## 2019-07-04 DIAGNOSIS — E119 Type 2 diabetes mellitus without complications: Secondary | ICD-10-CM | POA: Diagnosis not present

## 2019-07-04 DIAGNOSIS — R4181 Age-related cognitive decline: Secondary | ICD-10-CM | POA: Diagnosis not present

## 2019-07-04 DIAGNOSIS — F0390 Unspecified dementia without behavioral disturbance: Secondary | ICD-10-CM | POA: Diagnosis not present

## 2019-07-04 DIAGNOSIS — R1312 Dysphagia, oropharyngeal phase: Secondary | ICD-10-CM | POA: Diagnosis not present
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
## 2019-07-04 NOTE — Patient Instructions (Signed)
I do not see any primary neurological cause for your dizziness.  Your history does not sound like vertigo.  Your examination does not show any vertigo or significant blood pressure drop when you change position from sitting to standing.  Nevertheless, I do believe you could do a little bit better with your water intake.  Please drink 6 to 8 cups of water per day if possible, 8 ounce cup size, or at least 4 to 5 of the 12 ounce bottles of water per day. Please use your walker at all times and change positions slowly.  I do not recommend that you continue with meclizine on an ongoing basis.  It can cause side effects including sleepiness and Off-balance feeling especially if you do not have actual vertigo. Please follow-up regularly with your primary care physician or nurse practitioner.  I can see you back on an as-needed basis.

## 2019-07-05 DIAGNOSIS — E119 Type 2 diabetes mellitus without complications: Secondary | ICD-10-CM | POA: Diagnosis not present

## 2019-07-05 DIAGNOSIS — R1312 Dysphagia, oropharyngeal phase: Secondary | ICD-10-CM | POA: Diagnosis not present

## 2019-07-05 DIAGNOSIS — F0390 Unspecified dementia without behavioral disturbance: Secondary | ICD-10-CM | POA: Diagnosis not present

## 2019-07-05 DIAGNOSIS — R4181 Age-related cognitive decline: Secondary | ICD-10-CM | POA: Diagnosis not present

## 2019-07-08 DIAGNOSIS — F0390 Unspecified dementia without behavioral disturbance: Secondary | ICD-10-CM | POA: Diagnosis not present

## 2019-07-08 DIAGNOSIS — E119 Type 2 diabetes mellitus without complications: Secondary | ICD-10-CM | POA: Diagnosis not present

## 2019-07-08 DIAGNOSIS — R4181 Age-related cognitive decline: Secondary | ICD-10-CM | POA: Diagnosis not present

## 2019-07-08 DIAGNOSIS — R1312 Dysphagia, oropharyngeal phase: Secondary | ICD-10-CM | POA: Diagnosis not present

## 2019-07-10 DIAGNOSIS — F0390 Unspecified dementia without behavioral disturbance: Secondary | ICD-10-CM | POA: Diagnosis not present

## 2019-07-10 DIAGNOSIS — H353211 Exudative age-related macular degeneration, right eye, with active choroidal neovascularization: Secondary | ICD-10-CM | POA: Diagnosis not present

## 2019-07-10 DIAGNOSIS — R4181 Age-related cognitive decline: Secondary | ICD-10-CM | POA: Diagnosis not present

## 2019-07-10 DIAGNOSIS — R1312 Dysphagia, oropharyngeal phase: Secondary | ICD-10-CM | POA: Diagnosis not present

## 2019-07-10 DIAGNOSIS — E119 Type 2 diabetes mellitus without complications: Secondary | ICD-10-CM | POA: Diagnosis not present

## 2019-07-12 DIAGNOSIS — F0390 Unspecified dementia without behavioral disturbance: Secondary | ICD-10-CM | POA: Diagnosis not present

## 2019-07-12 DIAGNOSIS — R4181 Age-related cognitive decline: Secondary | ICD-10-CM | POA: Diagnosis not present

## 2019-07-12 DIAGNOSIS — R1312 Dysphagia, oropharyngeal phase: Secondary | ICD-10-CM | POA: Diagnosis not present

## 2019-07-12 DIAGNOSIS — E119 Type 2 diabetes mellitus without complications: Secondary | ICD-10-CM | POA: Diagnosis not present

## 2019-07-14 DIAGNOSIS — E119 Type 2 diabetes mellitus without complications: Secondary | ICD-10-CM | POA: Diagnosis not present

## 2019-07-14 DIAGNOSIS — F0390 Unspecified dementia without behavioral disturbance: Secondary | ICD-10-CM | POA: Diagnosis not present

## 2019-07-14 DIAGNOSIS — R4181 Age-related cognitive decline: Secondary | ICD-10-CM | POA: Diagnosis not present

## 2019-07-14 DIAGNOSIS — R1312 Dysphagia, oropharyngeal phase: Secondary | ICD-10-CM | POA: Diagnosis not present

## 2019-07-15 DIAGNOSIS — R4181 Age-related cognitive decline: Secondary | ICD-10-CM | POA: Diagnosis not present

## 2019-07-15 DIAGNOSIS — E119 Type 2 diabetes mellitus without complications: Secondary | ICD-10-CM | POA: Diagnosis not present

## 2019-07-15 DIAGNOSIS — F0390 Unspecified dementia without behavioral disturbance: Secondary | ICD-10-CM | POA: Diagnosis not present

## 2019-07-15 DIAGNOSIS — R1312 Dysphagia, oropharyngeal phase: Secondary | ICD-10-CM | POA: Diagnosis not present

## 2019-07-16 DIAGNOSIS — E08311 Diabetes mellitus due to underlying condition with unspecified diabetic retinopathy with macular edema: Secondary | ICD-10-CM | POA: Diagnosis not present

## 2019-07-16 DIAGNOSIS — D649 Anemia, unspecified: Secondary | ICD-10-CM | POA: Diagnosis not present

## 2019-07-16 DIAGNOSIS — R0602 Shortness of breath: Secondary | ICD-10-CM | POA: Diagnosis not present

## 2019-07-16 DIAGNOSIS — E876 Hypokalemia: Secondary | ICD-10-CM | POA: Diagnosis not present

## 2019-07-16 DIAGNOSIS — D508 Other iron deficiency anemias: Secondary | ICD-10-CM | POA: Diagnosis not present

## 2019-07-17 DIAGNOSIS — R1312 Dysphagia, oropharyngeal phase: Secondary | ICD-10-CM | POA: Diagnosis not present

## 2019-07-17 DIAGNOSIS — F0390 Unspecified dementia without behavioral disturbance: Secondary | ICD-10-CM | POA: Diagnosis not present

## 2019-07-17 DIAGNOSIS — R4181 Age-related cognitive decline: Secondary | ICD-10-CM | POA: Diagnosis not present

## 2019-07-17 DIAGNOSIS — E119 Type 2 diabetes mellitus without complications: Secondary | ICD-10-CM | POA: Diagnosis not present

## 2019-07-22 DIAGNOSIS — E119 Type 2 diabetes mellitus without complications: Secondary | ICD-10-CM | POA: Diagnosis not present

## 2019-07-22 DIAGNOSIS — Z20828 Contact with and (suspected) exposure to other viral communicable diseases: Secondary | ICD-10-CM | POA: Diagnosis not present

## 2019-07-22 DIAGNOSIS — R4181 Age-related cognitive decline: Secondary | ICD-10-CM | POA: Diagnosis not present

## 2019-07-22 DIAGNOSIS — R1312 Dysphagia, oropharyngeal phase: Secondary | ICD-10-CM | POA: Diagnosis not present

## 2019-07-22 DIAGNOSIS — F0390 Unspecified dementia without behavioral disturbance: Secondary | ICD-10-CM | POA: Diagnosis not present

## 2019-07-23 DIAGNOSIS — F0390 Unspecified dementia without behavioral disturbance: Secondary | ICD-10-CM | POA: Diagnosis not present

## 2019-07-23 DIAGNOSIS — R4181 Age-related cognitive decline: Secondary | ICD-10-CM | POA: Diagnosis not present

## 2019-07-23 DIAGNOSIS — E119 Type 2 diabetes mellitus without complications: Secondary | ICD-10-CM | POA: Diagnosis not present

## 2019-07-23 DIAGNOSIS — R1312 Dysphagia, oropharyngeal phase: Secondary | ICD-10-CM | POA: Diagnosis not present

## 2019-07-26 DIAGNOSIS — R4181 Age-related cognitive decline: Secondary | ICD-10-CM | POA: Diagnosis not present

## 2019-07-26 DIAGNOSIS — R1312 Dysphagia, oropharyngeal phase: Secondary | ICD-10-CM | POA: Diagnosis not present

## 2019-07-26 DIAGNOSIS — E119 Type 2 diabetes mellitus without complications: Secondary | ICD-10-CM | POA: Diagnosis not present

## 2019-07-26 DIAGNOSIS — F0390 Unspecified dementia without behavioral disturbance: Secondary | ICD-10-CM | POA: Diagnosis not present

## 2019-07-29 DIAGNOSIS — F0390 Unspecified dementia without behavioral disturbance: Secondary | ICD-10-CM | POA: Diagnosis not present

## 2019-07-29 DIAGNOSIS — R4181 Age-related cognitive decline: Secondary | ICD-10-CM | POA: Diagnosis not present

## 2019-07-29 DIAGNOSIS — R1312 Dysphagia, oropharyngeal phase: Secondary | ICD-10-CM | POA: Diagnosis not present

## 2019-07-29 DIAGNOSIS — E119 Type 2 diabetes mellitus without complications: Secondary | ICD-10-CM | POA: Diagnosis not present

## 2019-07-30 DIAGNOSIS — Z20828 Contact with and (suspected) exposure to other viral communicable diseases: Secondary | ICD-10-CM | POA: Diagnosis not present

## 2019-07-30 DIAGNOSIS — E08311 Diabetes mellitus due to underlying condition with unspecified diabetic retinopathy with macular edema: Secondary | ICD-10-CM | POA: Diagnosis not present

## 2019-07-30 DIAGNOSIS — D649 Anemia, unspecified: Secondary | ICD-10-CM | POA: Diagnosis not present

## 2019-07-30 DIAGNOSIS — E119 Type 2 diabetes mellitus without complications: Secondary | ICD-10-CM | POA: Diagnosis not present

## 2019-07-30 DIAGNOSIS — F0390 Unspecified dementia without behavioral disturbance: Secondary | ICD-10-CM | POA: Diagnosis not present

## 2019-07-30 DIAGNOSIS — R1312 Dysphagia, oropharyngeal phase: Secondary | ICD-10-CM | POA: Diagnosis not present

## 2019-07-30 DIAGNOSIS — R4181 Age-related cognitive decline: Secondary | ICD-10-CM | POA: Diagnosis not present

## 2019-07-30 DIAGNOSIS — R5382 Chronic fatigue, unspecified: Secondary | ICD-10-CM | POA: Diagnosis not present

## 2019-07-30 DIAGNOSIS — Z1383 Encounter for screening for respiratory disorder NEC: Secondary | ICD-10-CM | POA: Diagnosis not present

## 2019-07-31 DIAGNOSIS — R1312 Dysphagia, oropharyngeal phase: Secondary | ICD-10-CM | POA: Diagnosis not present

## 2019-07-31 DIAGNOSIS — E119 Type 2 diabetes mellitus without complications: Secondary | ICD-10-CM | POA: Diagnosis not present

## 2019-07-31 DIAGNOSIS — R4181 Age-related cognitive decline: Secondary | ICD-10-CM | POA: Diagnosis not present

## 2019-07-31 DIAGNOSIS — F0390 Unspecified dementia without behavioral disturbance: Secondary | ICD-10-CM | POA: Diagnosis not present

## 2019-08-02 DIAGNOSIS — F0390 Unspecified dementia without behavioral disturbance: Secondary | ICD-10-CM | POA: Diagnosis not present

## 2019-08-02 DIAGNOSIS — R4181 Age-related cognitive decline: Secondary | ICD-10-CM | POA: Diagnosis not present

## 2019-08-02 DIAGNOSIS — E119 Type 2 diabetes mellitus without complications: Secondary | ICD-10-CM | POA: Diagnosis not present

## 2019-08-02 DIAGNOSIS — R1312 Dysphagia, oropharyngeal phase: Secondary | ICD-10-CM | POA: Diagnosis not present

## 2019-08-04 DIAGNOSIS — R1312 Dysphagia, oropharyngeal phase: Secondary | ICD-10-CM | POA: Diagnosis not present

## 2019-08-04 DIAGNOSIS — R4181 Age-related cognitive decline: Secondary | ICD-10-CM | POA: Diagnosis not present

## 2019-08-04 DIAGNOSIS — E119 Type 2 diabetes mellitus without complications: Secondary | ICD-10-CM | POA: Diagnosis not present

## 2019-08-04 DIAGNOSIS — F0390 Unspecified dementia without behavioral disturbance: Secondary | ICD-10-CM | POA: Diagnosis not present

## 2019-08-06 DIAGNOSIS — E119 Type 2 diabetes mellitus without complications: Secondary | ICD-10-CM | POA: Diagnosis not present

## 2019-08-06 DIAGNOSIS — F0390 Unspecified dementia without behavioral disturbance: Secondary | ICD-10-CM | POA: Diagnosis not present

## 2019-08-06 DIAGNOSIS — R4181 Age-related cognitive decline: Secondary | ICD-10-CM | POA: Diagnosis not present

## 2019-08-06 DIAGNOSIS — R1312 Dysphagia, oropharyngeal phase: Secondary | ICD-10-CM | POA: Diagnosis not present

## 2019-08-09 DIAGNOSIS — R4181 Age-related cognitive decline: Secondary | ICD-10-CM | POA: Diagnosis not present

## 2019-08-09 DIAGNOSIS — E119 Type 2 diabetes mellitus without complications: Secondary | ICD-10-CM | POA: Diagnosis not present

## 2019-08-09 DIAGNOSIS — F0390 Unspecified dementia without behavioral disturbance: Secondary | ICD-10-CM | POA: Diagnosis not present

## 2019-08-09 DIAGNOSIS — R1312 Dysphagia, oropharyngeal phase: Secondary | ICD-10-CM | POA: Diagnosis not present

## 2019-08-10 DIAGNOSIS — R1312 Dysphagia, oropharyngeal phase: Secondary | ICD-10-CM | POA: Diagnosis not present

## 2019-08-10 DIAGNOSIS — R4181 Age-related cognitive decline: Secondary | ICD-10-CM | POA: Diagnosis not present

## 2019-08-10 DIAGNOSIS — F0390 Unspecified dementia without behavioral disturbance: Secondary | ICD-10-CM | POA: Diagnosis not present

## 2019-08-10 DIAGNOSIS — E119 Type 2 diabetes mellitus without complications: Secondary | ICD-10-CM | POA: Diagnosis not present

## 2019-08-12 DIAGNOSIS — F0390 Unspecified dementia without behavioral disturbance: Secondary | ICD-10-CM | POA: Diagnosis not present

## 2019-08-12 DIAGNOSIS — R1312 Dysphagia, oropharyngeal phase: Secondary | ICD-10-CM | POA: Diagnosis not present

## 2019-08-12 DIAGNOSIS — R4181 Age-related cognitive decline: Secondary | ICD-10-CM | POA: Diagnosis not present

## 2019-08-12 DIAGNOSIS — E119 Type 2 diabetes mellitus without complications: Secondary | ICD-10-CM | POA: Diagnosis not present

## 2019-08-14 DIAGNOSIS — R1312 Dysphagia, oropharyngeal phase: Secondary | ICD-10-CM | POA: Diagnosis not present

## 2019-08-14 DIAGNOSIS — R4181 Age-related cognitive decline: Secondary | ICD-10-CM | POA: Diagnosis not present

## 2019-08-14 DIAGNOSIS — F0390 Unspecified dementia without behavioral disturbance: Secondary | ICD-10-CM | POA: Diagnosis not present

## 2019-08-14 DIAGNOSIS — E119 Type 2 diabetes mellitus without complications: Secondary | ICD-10-CM | POA: Diagnosis not present

## 2019-08-15 DIAGNOSIS — R1312 Dysphagia, oropharyngeal phase: Secondary | ICD-10-CM | POA: Diagnosis not present

## 2019-08-15 DIAGNOSIS — E119 Type 2 diabetes mellitus without complications: Secondary | ICD-10-CM | POA: Diagnosis not present

## 2019-08-15 DIAGNOSIS — R4181 Age-related cognitive decline: Secondary | ICD-10-CM | POA: Diagnosis not present

## 2019-08-15 DIAGNOSIS — F0390 Unspecified dementia without behavioral disturbance: Secondary | ICD-10-CM | POA: Diagnosis not present

## 2019-08-16 DIAGNOSIS — F0390 Unspecified dementia without behavioral disturbance: Secondary | ICD-10-CM | POA: Diagnosis not present

## 2019-08-16 DIAGNOSIS — R4181 Age-related cognitive decline: Secondary | ICD-10-CM | POA: Diagnosis not present

## 2019-08-16 DIAGNOSIS — E119 Type 2 diabetes mellitus without complications: Secondary | ICD-10-CM | POA: Diagnosis not present

## 2019-08-16 DIAGNOSIS — R1312 Dysphagia, oropharyngeal phase: Secondary | ICD-10-CM | POA: Diagnosis not present

## 2019-08-19 DIAGNOSIS — E119 Type 2 diabetes mellitus without complications: Secondary | ICD-10-CM | POA: Diagnosis not present

## 2019-08-19 DIAGNOSIS — F0390 Unspecified dementia without behavioral disturbance: Secondary | ICD-10-CM | POA: Diagnosis not present

## 2019-08-19 DIAGNOSIS — R1312 Dysphagia, oropharyngeal phase: Secondary | ICD-10-CM | POA: Diagnosis not present

## 2019-08-19 DIAGNOSIS — R4181 Age-related cognitive decline: Secondary | ICD-10-CM | POA: Diagnosis not present

## 2019-08-20 DIAGNOSIS — E119 Type 2 diabetes mellitus without complications: Secondary | ICD-10-CM | POA: Diagnosis not present

## 2019-08-20 DIAGNOSIS — F0390 Unspecified dementia without behavioral disturbance: Secondary | ICD-10-CM | POA: Diagnosis not present

## 2019-08-20 DIAGNOSIS — R1312 Dysphagia, oropharyngeal phase: Secondary | ICD-10-CM | POA: Diagnosis not present

## 2019-08-20 DIAGNOSIS — R4181 Age-related cognitive decline: Secondary | ICD-10-CM | POA: Diagnosis not present

## 2019-08-21 DIAGNOSIS — F0390 Unspecified dementia without behavioral disturbance: Secondary | ICD-10-CM | POA: Diagnosis not present

## 2019-08-21 DIAGNOSIS — R1312 Dysphagia, oropharyngeal phase: Secondary | ICD-10-CM | POA: Diagnosis not present

## 2019-08-21 DIAGNOSIS — R4181 Age-related cognitive decline: Secondary | ICD-10-CM | POA: Diagnosis not present

## 2019-08-21 DIAGNOSIS — E119 Type 2 diabetes mellitus without complications: Secondary | ICD-10-CM | POA: Diagnosis not present

## 2019-08-25 DIAGNOSIS — R1312 Dysphagia, oropharyngeal phase: Secondary | ICD-10-CM | POA: Diagnosis not present

## 2019-08-25 DIAGNOSIS — E119 Type 2 diabetes mellitus without complications: Secondary | ICD-10-CM | POA: Diagnosis not present

## 2019-08-25 DIAGNOSIS — R4181 Age-related cognitive decline: Secondary | ICD-10-CM | POA: Diagnosis not present

## 2019-08-25 DIAGNOSIS — F0390 Unspecified dementia without behavioral disturbance: Secondary | ICD-10-CM | POA: Diagnosis not present

## 2019-08-27 DIAGNOSIS — F0390 Unspecified dementia without behavioral disturbance: Secondary | ICD-10-CM | POA: Diagnosis not present

## 2019-08-27 DIAGNOSIS — R4181 Age-related cognitive decline: Secondary | ICD-10-CM | POA: Diagnosis not present

## 2019-08-27 DIAGNOSIS — E119 Type 2 diabetes mellitus without complications: Secondary | ICD-10-CM | POA: Diagnosis not present

## 2019-08-27 DIAGNOSIS — R1312 Dysphagia, oropharyngeal phase: Secondary | ICD-10-CM | POA: Diagnosis not present

## 2019-09-04 DIAGNOSIS — R4181 Age-related cognitive decline: Secondary | ICD-10-CM | POA: Diagnosis not present

## 2019-09-04 DIAGNOSIS — F05 Delirium due to known physiological condition: Secondary | ICD-10-CM | POA: Diagnosis not present

## 2019-09-04 DIAGNOSIS — R531 Weakness: Secondary | ICD-10-CM | POA: Diagnosis not present

## 2019-09-04 DIAGNOSIS — Z8616 Personal history of COVID-19: Secondary | ICD-10-CM | POA: Diagnosis not present

## 2019-09-04 DIAGNOSIS — R1312 Dysphagia, oropharyngeal phase: Secondary | ICD-10-CM | POA: Diagnosis not present

## 2019-09-04 DIAGNOSIS — F0391 Unspecified dementia with behavioral disturbance: Secondary | ICD-10-CM | POA: Diagnosis not present

## 2019-09-04 DIAGNOSIS — E119 Type 2 diabetes mellitus without complications: Secondary | ICD-10-CM | POA: Diagnosis not present

## 2019-09-04 DIAGNOSIS — R29898 Other symptoms and signs involving the musculoskeletal system: Secondary | ICD-10-CM | POA: Diagnosis not present

## 2019-09-04 DIAGNOSIS — F0390 Unspecified dementia without behavioral disturbance: Secondary | ICD-10-CM | POA: Diagnosis not present

## 2019-09-09 DIAGNOSIS — R4181 Age-related cognitive decline: Secondary | ICD-10-CM | POA: Diagnosis not present

## 2019-09-09 DIAGNOSIS — R1312 Dysphagia, oropharyngeal phase: Secondary | ICD-10-CM | POA: Diagnosis not present

## 2019-09-09 DIAGNOSIS — F0390 Unspecified dementia without behavioral disturbance: Secondary | ICD-10-CM | POA: Diagnosis not present

## 2019-09-09 DIAGNOSIS — E119 Type 2 diabetes mellitus without complications: Secondary | ICD-10-CM | POA: Diagnosis not present

## 2019-09-12 DIAGNOSIS — N390 Urinary tract infection, site not specified: Secondary | ICD-10-CM | POA: Diagnosis not present

## 2019-09-12 DIAGNOSIS — F0391 Unspecified dementia with behavioral disturbance: Secondary | ICD-10-CM | POA: Diagnosis not present

## 2019-09-12 DIAGNOSIS — Z79899 Other long term (current) drug therapy: Secondary | ICD-10-CM | POA: Diagnosis not present

## 2019-09-17 DIAGNOSIS — M6281 Muscle weakness (generalized): Secondary | ICD-10-CM | POA: Diagnosis not present

## 2019-09-17 DIAGNOSIS — R278 Other lack of coordination: Secondary | ICD-10-CM | POA: Diagnosis not present

## 2019-09-17 DIAGNOSIS — R41841 Cognitive communication deficit: Secondary | ICD-10-CM | POA: Diagnosis not present

## 2019-09-17 DIAGNOSIS — R262 Difficulty in walking, not elsewhere classified: Secondary | ICD-10-CM | POA: Diagnosis not present

## 2019-09-17 DIAGNOSIS — R1312 Dysphagia, oropharyngeal phase: Secondary | ICD-10-CM | POA: Diagnosis not present

## 2019-09-17 DIAGNOSIS — R2681 Unsteadiness on feet: Secondary | ICD-10-CM | POA: Diagnosis not present

## 2019-09-18 ENCOUNTER — Emergency Department (HOSPITAL_COMMUNITY): Payer: Medicare Other

## 2019-09-18 ENCOUNTER — Encounter (HOSPITAL_COMMUNITY): Payer: Self-pay | Admitting: Emergency Medicine

## 2019-09-18 ENCOUNTER — Emergency Department (HOSPITAL_COMMUNITY)
Admission: EM | Admit: 2019-09-18 | Discharge: 2019-09-19 | Disposition: A | Discharge status: Home / Self Care | Payer: Medicare Other | Attending: Emergency Medicine | Admitting: Emergency Medicine

## 2019-09-18 ENCOUNTER — Other Ambulatory Visit: Payer: Self-pay

## 2019-09-18 DIAGNOSIS — Y999 Unspecified external cause status: Secondary | ICD-10-CM | POA: Insufficient documentation

## 2019-09-18 DIAGNOSIS — Z79899 Other long term (current) drug therapy: Secondary | ICD-10-CM | POA: Insufficient documentation

## 2019-09-18 DIAGNOSIS — S0990XA Unspecified injury of head, initial encounter: Secondary | ICD-10-CM | POA: Diagnosis not present

## 2019-09-18 DIAGNOSIS — R2681 Unsteadiness on feet: Secondary | ICD-10-CM | POA: Diagnosis not present

## 2019-09-18 DIAGNOSIS — M6281 Muscle weakness (generalized): Secondary | ICD-10-CM | POA: Diagnosis not present

## 2019-09-18 DIAGNOSIS — S0003XA Contusion of scalp, initial encounter: Secondary | ICD-10-CM | POA: Diagnosis not present

## 2019-09-18 DIAGNOSIS — W19XXXA Unspecified fall, initial encounter: Secondary | ICD-10-CM | POA: Diagnosis not present

## 2019-09-18 DIAGNOSIS — R262 Difficulty in walking, not elsewhere classified: Secondary | ICD-10-CM | POA: Diagnosis not present

## 2019-09-18 DIAGNOSIS — Z8673 Personal history of transient ischemic attack (TIA), and cerebral infarction without residual deficits: Secondary | ICD-10-CM | POA: Insufficient documentation

## 2019-09-18 DIAGNOSIS — Y92129 Unspecified place in nursing home as the place of occurrence of the external cause: Secondary | ICD-10-CM | POA: Diagnosis not present

## 2019-09-18 DIAGNOSIS — I10 Essential (primary) hypertension: Secondary | ICD-10-CM | POA: Insufficient documentation

## 2019-09-18 DIAGNOSIS — F0390 Unspecified dementia without behavioral disturbance: Secondary | ICD-10-CM | POA: Diagnosis not present

## 2019-09-18 DIAGNOSIS — E119 Type 2 diabetes mellitus without complications: Secondary | ICD-10-CM | POA: Insufficient documentation

## 2019-09-18 DIAGNOSIS — Y939 Activity, unspecified: Secondary | ICD-10-CM | POA: Diagnosis not present

## 2019-09-18 DIAGNOSIS — R001 Bradycardia, unspecified: Secondary | ICD-10-CM | POA: Diagnosis not present

## 2019-09-18 DIAGNOSIS — R000 Tachycardia, unspecified: Secondary | ICD-10-CM | POA: Diagnosis not present

## 2019-09-18 DIAGNOSIS — R278 Other lack of coordination: Secondary | ICD-10-CM | POA: Diagnosis not present

## 2019-09-18 DIAGNOSIS — R1312 Dysphagia, oropharyngeal phase: Secondary | ICD-10-CM | POA: Diagnosis not present

## 2019-09-18 DIAGNOSIS — R41841 Cognitive communication deficit: Secondary | ICD-10-CM | POA: Diagnosis not present

## 2019-09-18 DIAGNOSIS — Z7984 Long term (current) use of oral hypoglycemic drugs: Secondary | ICD-10-CM | POA: Diagnosis not present

## 2019-09-18 NOTE — ED Triage Notes (Addendum)
Patient arrived with EMS from [facility] , staff reported unwitnessed fall this evening , found on the bathroom floor , no LOC ,staff suspects head injury, history of dementia , she can not recall incident , ambulates using a walker , denies pain/respirations unlabored. CBG= 202. Currently taking antibiotics for UTI.

## 2019-09-19 DIAGNOSIS — F0390 Unspecified dementia without behavioral disturbance: Secondary | ICD-10-CM | POA: Diagnosis not present

## 2019-09-19 DIAGNOSIS — S0003XA Contusion of scalp, initial encounter: Secondary | ICD-10-CM | POA: Diagnosis not present

## 2019-09-19 DIAGNOSIS — R000 Tachycardia, unspecified: Secondary | ICD-10-CM | POA: Diagnosis not present

## 2019-09-19 DIAGNOSIS — S0990XA Unspecified injury of head, initial encounter: Secondary | ICD-10-CM | POA: Diagnosis not present

## 2019-09-19 LAB — CBC WITH DIFFERENTIAL/PLATELET
Abs Immature Granulocytes: 0.06 10*3/uL (ref 0.00–0.07)
Basophils Absolute: 0 10*3/uL (ref 0.0–0.1)
Basophils Relative: 0 %
Eosinophils Absolute: 0 10*3/uL (ref 0.0–0.5)
Eosinophils Relative: 0 %
HCT: 37.2 % (ref 36.0–46.0)
Hemoglobin: 12.5 g/dL (ref 12.0–15.0)
Immature Granulocytes: 1 %
Lymphocytes Relative: 30 %
Lymphs Abs: 3.5 10*3/uL (ref 0.7–4.0)
MCH: 33.2 pg (ref 26.0–34.0)
MCHC: 33.6 g/dL (ref 30.0–36.0)
MCV: 98.7 fL (ref 80.0–100.0)
Monocytes Absolute: 0.7 10*3/uL (ref 0.1–1.0)
Monocytes Relative: 6 %
Neutro Abs: 7.3 10*3/uL (ref 1.7–7.7)
Neutrophils Relative %: 63 %
Platelets: 383 10*3/uL (ref 150–400)
RBC: 3.77 MIL/uL — ABNORMAL LOW (ref 3.87–5.11)
RDW: 17.2 % — ABNORMAL HIGH (ref 11.5–15.5)
WBC: 11.6 10*3/uL — ABNORMAL HIGH (ref 4.0–10.5)
nRBC: 0 % (ref 0.0–0.2)

## 2019-09-19 LAB — COMPREHENSIVE METABOLIC PANEL
ALT: 14 U/L (ref 0–44)
AST: 22 U/L (ref 15–41)
Albumin: 3.8 g/dL (ref 3.5–5.0)
Alkaline Phosphatase: 44 U/L (ref 38–126)
Anion gap: 14 (ref 5–15)
BUN: 41 mg/dL — ABNORMAL HIGH (ref 8–23)
CO2: 18 mmol/L — ABNORMAL LOW (ref 22–32)
Calcium: 8.9 mg/dL (ref 8.9–10.3)
Chloride: 102 mmol/L (ref 98–111)
Creatinine, Ser: 1.16 mg/dL — ABNORMAL HIGH (ref 0.44–1.00)
GFR calc Af Amer: 47 mL/min — ABNORMAL LOW (ref >60)
GFR calc non Af Amer: 41 mL/min — ABNORMAL LOW (ref >60)
Glucose, Bld: 177 mg/dL — ABNORMAL HIGH (ref 70–99)
Potassium: 4.6 mmol/L (ref 3.5–5.1)
Sodium: 134 mmol/L — ABNORMAL LOW (ref 135–145)
Total Bilirubin: 1.6 mg/dL — ABNORMAL HIGH (ref 0.3–1.2)
Total Protein: 6.5 g/dL (ref 6.5–8.1)

## 2019-09-19 NOTE — ED Notes (Signed)
Large hematoma on the right buttock was observed.
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
## 2019-09-20 DIAGNOSIS — R4181 Age-related cognitive decline: Secondary | ICD-10-CM | POA: Diagnosis not present

## 2019-09-20 DIAGNOSIS — F0390 Unspecified dementia without behavioral disturbance: Secondary | ICD-10-CM | POA: Diagnosis not present

## 2019-09-20 DIAGNOSIS — M6281 Muscle weakness (generalized): Secondary | ICD-10-CM | POA: Diagnosis not present

## 2019-09-20 DIAGNOSIS — R278 Other lack of coordination: Secondary | ICD-10-CM | POA: Diagnosis not present

## 2019-09-20 DIAGNOSIS — R1312 Dysphagia, oropharyngeal phase: Secondary | ICD-10-CM | POA: Diagnosis not present

## 2019-09-20 DIAGNOSIS — R41841 Cognitive communication deficit: Secondary | ICD-10-CM | POA: Diagnosis not present

## 2019-09-20 DIAGNOSIS — R2681 Unsteadiness on feet: Secondary | ICD-10-CM | POA: Diagnosis not present

## 2019-09-20 DIAGNOSIS — R262 Difficulty in walking, not elsewhere classified: Secondary | ICD-10-CM | POA: Diagnosis not present

## 2019-09-20 DIAGNOSIS — E119 Type 2 diabetes mellitus without complications: Secondary | ICD-10-CM | POA: Diagnosis not present

## 2019-09-25 DIAGNOSIS — R2681 Unsteadiness on feet: Secondary | ICD-10-CM | POA: Diagnosis not present

## 2019-09-25 DIAGNOSIS — R1312 Dysphagia, oropharyngeal phase: Secondary | ICD-10-CM | POA: Diagnosis not present

## 2019-09-25 DIAGNOSIS — K1379 Other lesions of oral mucosa: Secondary | ICD-10-CM | POA: Diagnosis not present

## 2019-09-25 DIAGNOSIS — R41841 Cognitive communication deficit: Secondary | ICD-10-CM | POA: Diagnosis not present

## 2019-09-25 DIAGNOSIS — M6281 Muscle weakness (generalized): Secondary | ICD-10-CM | POA: Diagnosis not present

## 2019-09-25 DIAGNOSIS — R262 Difficulty in walking, not elsewhere classified: Secondary | ICD-10-CM | POA: Diagnosis not present

## 2019-09-25 DIAGNOSIS — K120 Recurrent oral aphthae: Secondary | ICD-10-CM | POA: Diagnosis not present

## 2019-09-25 DIAGNOSIS — R278 Other lack of coordination: Secondary | ICD-10-CM | POA: Diagnosis not present

## 2019-09-26 DIAGNOSIS — R41841 Cognitive communication deficit: Secondary | ICD-10-CM | POA: Diagnosis not present

## 2019-09-26 DIAGNOSIS — R1312 Dysphagia, oropharyngeal phase: Secondary | ICD-10-CM | POA: Diagnosis not present

## 2019-09-26 DIAGNOSIS — R262 Difficulty in walking, not elsewhere classified: Secondary | ICD-10-CM | POA: Diagnosis not present

## 2019-09-26 DIAGNOSIS — R278 Other lack of coordination: Secondary | ICD-10-CM | POA: Diagnosis not present

## 2019-09-26 DIAGNOSIS — R2681 Unsteadiness on feet: Secondary | ICD-10-CM | POA: Diagnosis not present

## 2019-09-26 DIAGNOSIS — M6281 Muscle weakness (generalized): Secondary | ICD-10-CM | POA: Diagnosis not present

## 2019-09-27 DIAGNOSIS — R41841 Cognitive communication deficit: Secondary | ICD-10-CM | POA: Diagnosis not present

## 2019-09-27 DIAGNOSIS — R2681 Unsteadiness on feet: Secondary | ICD-10-CM | POA: Diagnosis not present

## 2019-09-27 DIAGNOSIS — R278 Other lack of coordination: Secondary | ICD-10-CM | POA: Diagnosis not present

## 2019-09-27 DIAGNOSIS — R1312 Dysphagia, oropharyngeal phase: Secondary | ICD-10-CM | POA: Diagnosis not present

## 2019-09-27 DIAGNOSIS — M6281 Muscle weakness (generalized): Secondary | ICD-10-CM | POA: Diagnosis not present

## 2019-09-27 DIAGNOSIS — R262 Difficulty in walking, not elsewhere classified: Secondary | ICD-10-CM | POA: Diagnosis not present

## 2019-09-28 ENCOUNTER — Emergency Department (HOSPITAL_BASED_OUTPATIENT_CLINIC_OR_DEPARTMENT_OTHER): Payer: Medicare Other

## 2019-09-28 ENCOUNTER — Other Ambulatory Visit: Payer: Self-pay

## 2019-09-28 ENCOUNTER — Encounter (HOSPITAL_BASED_OUTPATIENT_CLINIC_OR_DEPARTMENT_OTHER): Payer: Self-pay | Admitting: Emergency Medicine

## 2019-09-28 ENCOUNTER — Inpatient Hospital Stay (HOSPITAL_BASED_OUTPATIENT_CLINIC_OR_DEPARTMENT_OTHER)
Admission: EM | Admit: 2019-09-28 | Discharge: 2019-09-30 | DRG: 872 | Disposition: A | Discharge status: Hospice | Payer: Medicare Other | Source: Skilled Nursing Facility | Attending: Family Medicine | Admitting: Family Medicine

## 2019-09-28 DIAGNOSIS — R609 Edema, unspecified: Secondary | ICD-10-CM | POA: Diagnosis not present

## 2019-09-28 DIAGNOSIS — L89151 Pressure ulcer of sacral region, stage 1: Secondary | ICD-10-CM | POA: Diagnosis not present

## 2019-09-28 DIAGNOSIS — Z8249 Family history of ischemic heart disease and other diseases of the circulatory system: Secondary | ICD-10-CM

## 2019-09-28 DIAGNOSIS — Z87440 Personal history of urinary (tract) infections: Secondary | ICD-10-CM

## 2019-09-28 DIAGNOSIS — Z8711 Personal history of peptic ulcer disease: Secondary | ICD-10-CM | POA: Diagnosis not present

## 2019-09-28 DIAGNOSIS — Z6821 Body mass index (BMI) 21.0-21.9, adult: Secondary | ICD-10-CM | POA: Diagnosis not present

## 2019-09-28 DIAGNOSIS — R519 Headache, unspecified: Secondary | ICD-10-CM | POA: Diagnosis not present

## 2019-09-28 DIAGNOSIS — Z888 Allergy status to other drugs, medicaments and biological substances status: Secondary | ICD-10-CM

## 2019-09-28 DIAGNOSIS — M810 Age-related osteoporosis without current pathological fracture: Secondary | ICD-10-CM | POA: Diagnosis present

## 2019-09-28 DIAGNOSIS — E785 Hyperlipidemia, unspecified: Secondary | ICD-10-CM | POA: Diagnosis present

## 2019-09-28 DIAGNOSIS — H905 Unspecified sensorineural hearing loss: Secondary | ICD-10-CM | POA: Diagnosis present

## 2019-09-28 DIAGNOSIS — E860 Dehydration: Secondary | ICD-10-CM | POA: Diagnosis present

## 2019-09-28 DIAGNOSIS — R6520 Severe sepsis without septic shock: Secondary | ICD-10-CM | POA: Diagnosis not present

## 2019-09-28 DIAGNOSIS — I10 Essential (primary) hypertension: Secondary | ICD-10-CM | POA: Diagnosis present

## 2019-09-28 DIAGNOSIS — K279 Peptic ulcer, site unspecified, unspecified as acute or chronic, without hemorrhage or perforation: Secondary | ICD-10-CM | POA: Diagnosis not present

## 2019-09-28 DIAGNOSIS — Z66 Do not resuscitate: Secondary | ICD-10-CM | POA: Diagnosis present

## 2019-09-28 DIAGNOSIS — S0990XA Unspecified injury of head, initial encounter: Secondary | ICD-10-CM | POA: Diagnosis not present

## 2019-09-28 DIAGNOSIS — R531 Weakness: Secondary | ICD-10-CM | POA: Diagnosis not present

## 2019-09-28 DIAGNOSIS — Z8616 Personal history of COVID-19: Secondary | ICD-10-CM | POA: Diagnosis not present

## 2019-09-28 DIAGNOSIS — I4891 Unspecified atrial fibrillation: Secondary | ICD-10-CM | POA: Diagnosis present

## 2019-09-28 DIAGNOSIS — Z91048 Other nonmedicinal substance allergy status: Secondary | ICD-10-CM | POA: Diagnosis not present

## 2019-09-28 DIAGNOSIS — Z515 Encounter for palliative care: Secondary | ICD-10-CM | POA: Diagnosis not present

## 2019-09-28 DIAGNOSIS — E872 Acidosis, unspecified: Secondary | ICD-10-CM | POA: Diagnosis present

## 2019-09-28 DIAGNOSIS — Z8673 Personal history of transient ischemic attack (TIA), and cerebral infarction without residual deficits: Secondary | ICD-10-CM | POA: Diagnosis not present

## 2019-09-28 DIAGNOSIS — Z7984 Long term (current) use of oral hypoglycemic drugs: Secondary | ICD-10-CM

## 2019-09-28 DIAGNOSIS — U071 COVID-19: Secondary | ICD-10-CM | POA: Diagnosis not present

## 2019-09-28 DIAGNOSIS — F0390 Unspecified dementia without behavioral disturbance: Secondary | ICD-10-CM | POA: Diagnosis present

## 2019-09-28 DIAGNOSIS — A419 Sepsis, unspecified organism: Secondary | ICD-10-CM | POA: Diagnosis present

## 2019-09-28 DIAGNOSIS — I120 Hypertensive chronic kidney disease with stage 5 chronic kidney disease or end stage renal disease: Secondary | ICD-10-CM | POA: Diagnosis not present

## 2019-09-28 DIAGNOSIS — R32 Unspecified urinary incontinence: Secondary | ICD-10-CM | POA: Diagnosis not present

## 2019-09-28 DIAGNOSIS — N179 Acute kidney failure, unspecified: Secondary | ICD-10-CM | POA: Diagnosis present

## 2019-09-28 DIAGNOSIS — E119 Type 2 diabetes mellitus without complications: Secondary | ICD-10-CM | POA: Diagnosis present

## 2019-09-28 DIAGNOSIS — Z741 Need for assistance with personal care: Secondary | ICD-10-CM | POA: Diagnosis not present

## 2019-09-28 DIAGNOSIS — Z79899 Other long term (current) drug therapy: Secondary | ICD-10-CM | POA: Diagnosis not present

## 2019-09-28 DIAGNOSIS — E1122 Type 2 diabetes mellitus with diabetic chronic kidney disease: Secondary | ICD-10-CM | POA: Diagnosis not present

## 2019-09-28 DIAGNOSIS — R627 Adult failure to thrive: Secondary | ICD-10-CM | POA: Diagnosis present

## 2019-09-28 DIAGNOSIS — Z7401 Bed confinement status: Secondary | ICD-10-CM | POA: Diagnosis not present

## 2019-09-28 DIAGNOSIS — N186 End stage renal disease: Secondary | ICD-10-CM | POA: Diagnosis not present

## 2019-09-28 DIAGNOSIS — M2550 Pain in unspecified joint: Secondary | ICD-10-CM | POA: Diagnosis not present

## 2019-09-28 LAB — COMPREHENSIVE METABOLIC PANEL
ALT: 17 U/L (ref 0–44)
AST: 36 U/L (ref 15–41)
Albumin: 3.4 g/dL — ABNORMAL LOW (ref 3.5–5.0)
Alkaline Phosphatase: 54 U/L (ref 38–126)
Anion gap: >20 — ABNORMAL HIGH (ref 5–15)
BUN: 63 mg/dL — ABNORMAL HIGH (ref 8–23)
CO2: 6 mmol/L — ABNORMAL LOW (ref 22–32)
Calcium: 8.4 mg/dL — ABNORMAL LOW (ref 8.9–10.3)
Chloride: 103 mmol/L (ref 98–111)
Creatinine, Ser: 3.54 mg/dL — ABNORMAL HIGH (ref 0.44–1.00)
GFR calc Af Amer: 12 mL/min — ABNORMAL LOW (ref >60)
GFR calc non Af Amer: 11 mL/min — ABNORMAL LOW (ref >60)
Glucose, Bld: 174 mg/dL — ABNORMAL HIGH (ref 70–99)
Potassium: 4.5 mmol/L (ref 3.5–5.1)
Sodium: 132 mmol/L — ABNORMAL LOW (ref 135–145)
Total Bilirubin: 0.7 mg/dL (ref 0.3–1.2)
Total Protein: 6.5 g/dL (ref 6.5–8.1)

## 2019-09-28 LAB — CBC WITH DIFFERENTIAL/PLATELET
Abs Immature Granulocytes: 0.11 10*3/uL — ABNORMAL HIGH (ref 0.00–0.07)
Basophils Absolute: 0 10*3/uL (ref 0.0–0.1)
Basophils Relative: 0 %
Eosinophils Absolute: 0 10*3/uL (ref 0.0–0.5)
Eosinophils Relative: 0 %
HCT: 36.4 % (ref 36.0–46.0)
Hemoglobin: 11.7 g/dL — ABNORMAL LOW (ref 12.0–15.0)
Immature Granulocytes: 1 %
Lymphocytes Relative: 23 %
Lymphs Abs: 4 10*3/uL (ref 0.7–4.0)
MCH: 33.3 pg (ref 26.0–34.0)
MCHC: 32.1 g/dL (ref 30.0–36.0)
MCV: 103.7 fL — ABNORMAL HIGH (ref 80.0–100.0)
Monocytes Absolute: 0.6 10*3/uL (ref 0.1–1.0)
Monocytes Relative: 4 %
Neutro Abs: 12.4 10*3/uL — ABNORMAL HIGH (ref 1.7–7.7)
Neutrophils Relative %: 72 %
Platelets: 306 10*3/uL (ref 150–400)
RBC: 3.51 MIL/uL — ABNORMAL LOW (ref 3.87–5.11)
RDW: 18.8 % — ABNORMAL HIGH (ref 11.5–15.5)
WBC: 17.2 10*3/uL — ABNORMAL HIGH (ref 4.0–10.5)
nRBC: 0 % (ref 0.0–0.2)

## 2019-09-28 LAB — POCT I-STAT 7, (LYTES, BLD GAS, ICA,H+H)
Acid-base deficit: 25 mmol/L — ABNORMAL HIGH (ref 0.0–2.0)
Bicarbonate: 5.3 mmol/L — ABNORMAL LOW (ref 20.0–28.0)
Calcium, Ion: 1.14 mmol/L — ABNORMAL LOW (ref 1.15–1.40)
HCT: 33 % — ABNORMAL LOW (ref 36.0–46.0)
Hemoglobin: 11.2 g/dL — ABNORMAL LOW (ref 12.0–15.0)
O2 Saturation: 24 %
Patient temperature: 97.2
Potassium: 5 mmol/L (ref 3.5–5.1)
Sodium: 132 mmol/L — ABNORMAL LOW (ref 135–145)
TCO2: 6 mmol/L — ABNORMAL LOW (ref 22–32)
pCO2 arterial: 20.9 mmHg — ABNORMAL LOW (ref 32.0–48.0)
pH, Arterial: 7.005 — CL (ref 7.350–7.450)
pO2, Arterial: 24 mmHg — CL (ref 83.0–108.0)

## 2019-09-28 LAB — LACTIC ACID, PLASMA
Lactic Acid, Venous: 9.5 mmol/L (ref 0.5–1.9)
Lactic Acid, Venous: >11 mmol/L (ref 0.5–1.9)

## 2019-09-28 LAB — PROTIME-INR
INR: 1.1 (ref 0.8–1.2)
Prothrombin Time: 13.9 seconds (ref 11.4–15.2)

## 2019-09-28 LAB — APTT: aPTT: 31 seconds (ref 24–36)

## 2019-09-28 LAB — CBG MONITORING, ED: Glucose-Capillary: 166 mg/dL — ABNORMAL HIGH (ref 70–99)

## 2019-09-28 MED ORDER — LACTATED RINGERS IV BOLUS (SEPSIS)
250.0000 mL | Freq: Once | INTRAVENOUS | Status: AC
  Administered 2019-09-28: 250 mL via INTRAVENOUS

## 2019-09-28 MED ORDER — LORAZEPAM 2 MG/ML IJ SOLN: 0.5000 mg | Freq: Four times a day (QID) | INTRAMUSCULAR | Status: DC | PRN

## 2019-09-28 MED ORDER — LACTATED RINGERS IV BOLUS (SEPSIS)
500.0000 mL | Freq: Once | INTRAVENOUS | Status: AC
  Administered 2019-09-28: 500 mL via INTRAVENOUS

## 2019-09-28 MED ORDER — LACTATED RINGERS IV SOLN: INTRAVENOUS | Status: DC

## 2019-09-28 MED ORDER — METRONIDAZOLE IN NACL 5-0.79 MG/ML-% IV SOLN
500.0000 mg | Freq: Once | INTRAVENOUS | Status: AC
  Administered 2019-09-28: 500 mg via INTRAVENOUS
  Filled 2019-09-28: qty 100

## 2019-09-28 MED ORDER — LACTATED RINGERS IV BOLUS (SEPSIS)
1000.0000 mL | Freq: Once | INTRAVENOUS | Status: AC
  Administered 2019-09-28: 1000 mL via INTRAVENOUS

## 2019-09-28 MED ORDER — MORPHINE SULFATE (PF) 2 MG/ML IV SOLN: 2.0000 mg | INTRAVENOUS | Status: DC | PRN

## 2019-09-28 MED ORDER — CEFEPIME HCL 1 G IJ SOLR
INTRAMUSCULAR | Status: AC
  Filled 2019-09-28: qty 1

## 2019-09-28 MED ORDER — VANCOMYCIN HCL IN DEXTROSE 1-5 GM/200ML-% IV SOLN
1000.0000 mg | Freq: Once | INTRAVENOUS | Status: AC
  Administered 2019-09-28: 1000 mg via INTRAVENOUS
  Filled 2019-09-28: qty 200

## 2019-09-28 MED ORDER — VANCOMYCIN VARIABLE DOSE PER UNSTABLE RENAL FUNCTION (PHARMACIST DOSING)
Status: DC
  Filled 2019-09-28: qty 1

## 2019-09-28 MED ORDER — SODIUM CHLORIDE 0.9 % IV SOLN: 2.0000 g | Freq: Once | INTRAVENOUS | Status: DC

## 2019-09-28 MED ORDER — SODIUM CHLORIDE 0.9 % IV SOLN
1.0000 g | INTRAVENOUS | Status: DC
  Administered 2019-09-28: 17:00:00 1 g via INTRAVENOUS

## 2019-09-28 NOTE — Progress Notes (Signed)
Pharmacy Antibiotic Note  Laura Swanson is a 86 y.o. female admitted on 09/28/2019 with sepsis.  Pharmacy has been consulted for Cefepime and Vancomycin dosing.  Height: 5\' 2"  (157.5 cm) Weight: 118 lb (53.5 kg) IBW/kg (Calculated) : 50.1  Temp (24hrs), Avg:97.2 F (36.2 C), Min:97.2 F (36.2 C), Max:97.2 F (36.2 C)  Recent Labs  Lab 09/28/19 1313  WBC 17.2*  CREATININE 3.54*  LATICACIDVEN 9.5*    Estimated Creatinine Clearance: 8 mL/min (A) (by C-G formula based on SCr of 3.54 mg/dL (H)).    Allergies  Allergen Reactions  . Iodine Hives  . Repaglinide Other (See Comments)    Drop blood glucose and became very dizzy with near syncope   . Lipitor [Atorvastatin] Other (See Comments)    cramps  . Rosuvastatin Calcium Other (See Comments)    Cramps- tolerating taking once weekly Cramps  . Statins Other (See Comments)    Cramps   . Antiseptic Products, Misc.     Unknown  . Disinfectant Products Misc     Unknown  . Verapamil Other (See Comments)    constipation    Antimicrobials this admission: 1/30 Cefepime >>  1/30 Vancomycin >>   Dose adjustments this admission:   Microbiology results: 1/30 BCx: Pending 1/30 UCx: Pending   Plan:  - Cefepime 1g q24h  - Vancomycin 1000mg  IV x 1 dose  - Will dose based on random levels 2/2  AKI  - Monitor patients renal function for improvements and adjust as needed   Thank you for allowing pharmacy to be a part of this patient's care.  Duanne Limerick PharmD. BCPS  09/28/2019 2:16 PM

## 2019-09-28 NOTE — ED Notes (Signed)
Patient transported to radiology

## 2019-09-28 NOTE — ED Notes (Signed)
Pt on cardiac monitor.

## 2019-09-28 NOTE — ED Notes (Signed)
X-Ray at bedside.

## 2019-09-28 NOTE — ED Provider Notes (Addendum)
Signout note  86 year old lady recent Covid infection presenting to ER with concern for malaise, dehydration.  Work-up was concerning for renal failure, a profound lactic acidosis, A. fib with RVR.  Concern for sepsis.  pH 7.0, initial lactate 9.5, bicarb 6, creatinine 3.5, BUN 63, WBC 17.  Given fluid bolus, broad spectrum abx. Case was discussed with critical care who recommended repeating lactic.  Given patient is not on pressors, if lactate trending down, does not need ICU admission.  If lactate worsening, likely ICU appropriate.  Repeat lactate was greater than 11.  I reassessed patient, she has not produced any urine despite 30 mL/kg bolus, increasing confusion, persistent tachycardia, BP borderline.  Given her profound lactic acidosis, suspect severe sepsis, possibly ischemic bowel given this rising lactate though patient had no focal abd complaints.  Palliative conversation was initiated by Dr. Maryan Rued.  Family wanted more time to discuss.  I had lengthy conversation with patient's daughter, Laura Swanson, Laura Swanson at bedside as well as remainder of siblings via [name] regarding current clinical status, poor prognosis.  They are all in agreement, that they would like to focus on comfort measures only.  Plan:  Consult hospitalist for palliative admission  Updated staff Updated DNR in epic   Lucrezia Starch, MD 09/28/19 1647   ADDENDUM - discussed with [name] who will accept, d/c'd abx, placed orders for diet, prn morphine, ativan   Lucrezia Starch, MD 09/28/19 1706

## 2019-09-28 NOTE — ED Notes (Signed)
Unsuccessful IV attempt, LAC ?

## 2019-09-28 NOTE — ED Notes (Signed)
Lactic Acid 9.5, result given to Dr Maryan Rued and RN

## 2019-09-28 NOTE — Progress Notes (Signed)
86 y/o presented with AKI with severe metabolic acidosis and anuria, afib with RVR and probably sepsis as well, family decided for comfort measure only.

## 2019-09-28 NOTE — ED Notes (Signed)
Adjusted in bed for comfort.  Placed purewick at this time.  Large purple bruise noted to right hip and scattered to lower ext.  Daughter at the bedside.  Encouraged to call for assistance as needed.

## 2019-09-28 NOTE — ED Notes (Signed)
Spoke with Ruby at [facility] regarding consult with intensivist

## 2019-09-28 NOTE — Progress Notes (Signed)
Pt received from [facility] via [facility] for comfort care into [phone].  Pt has 2 daughters and they are at bedside. Unable to obtain temp, others vital signs stable.  O2 sats 100 on RA.  Sacral foam placed on sacral area for prevention.  PurWick placed.  Pt thirsty and given water and chocolate Ensure.

## 2019-09-28 NOTE — ED Triage Notes (Addendum)
Pt lives at assisted living facility for rehab after COVID. Her daughter states she has been improving. She reports she has multiple sores in her mouth and has not been eating and drinking x 1 week and has been generally weak. She is also holding her abd. Distention noted.
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
## 2019-09-29 DIAGNOSIS — E872 Acidosis, unspecified: Secondary | ICD-10-CM | POA: Diagnosis present

## 2019-09-29 DIAGNOSIS — N179 Acute kidney failure, unspecified: Secondary | ICD-10-CM

## 2019-09-29 DIAGNOSIS — Z515 Encounter for palliative care: Secondary | ICD-10-CM
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
## 2019-09-29 NOTE — Plan of Care (Signed)
  Problem: Role Relationship: Goal: Family's ability to cope with current situation will improve Outcome: Progressing   

## 2019-09-30 NOTE — TOC Initial Note (Addendum)
Transition of Care [facility]) - Initial/Assessment Note    Patient Details  Name: Laura Swanson MRN: [id number] Date of Birth: 06/17/1925  Transition of Care [facility]) CM/SW Contact:    Laura Favre, RN Phone Number: 09/30/2019, 9:49 AM  Clinical Narrative:    Update heard back from [place], per [facility] [place] criteria patient does not qualify for residential hospice.    Spoke with daughter Laura Swanson . Laura Swanson upset and crying. She will continue talking to her sisters. Patient from ALF , family was paying for an aide 4 hours a day prior to admission. Family discussing whether they want patient to go to SNF or to one of their homes with hospice. Daughters aware  hospice will not be in home 24/7 and family will have to provide personal care or hire someone .Awaiting call back   NCM and Lorriane Shire spoke to daughter at bedside with Laura Swanson and Laura Swanson on speaker phone. Explained Medicare would not pay for long term care at a SNF it would be out of pocket. Daughters are interested in [facility]. NCM will make referral to [facility]. If patient does not qualify for [facility], daughters plan to take their mother back to [facility] and hire 24/7 care and have hospice. Laura Swanson calling [facility] to discuss.   Made referral to [facility].     Spoke to two daughters at bedside . Called daughter Laura Swanson and placed on speaker phone. Daughters in agreement , they would like their mother transferred to Residential hospice home in [place]. Laura Swanson is the contact person, confirmed phone number [phone]. Explained NCM will make referral to [place] , they will review clinicals to see if their mother meets their criteria. All voiced understanding.   Spoke with Laura Swanson, she will review clinicals. Await call back.   Expected Discharge Plan: [place]     Patient Goals and CMS Choice Patient states their goals for this hospitalization and ongoing recovery  are:: spoke with two daughters at bedside , wish transfer to [facility] in [place]   Choice offered to / list presented to : Adult Children  Expected Discharge Plan and Services Expected Discharge Plan: [place] In-house Referral: Hospice / Palliative Care Discharge Planning Services: CM Consult   Living arrangements for the past 2 months: Single Family Home                 DME Arranged: N/A         HH Arranged: NA          Prior Living Arrangements/Services Living arrangements for the past 2 months: Single Family Home                     Activities of Daily Living      Permission Sought/Granted   Permission granted to share information with : Yes, Verbal Permission Granted  Share Information with NAME: Daughters           Emotional Assessment              Admission diagnosis:  Dehydration [W09.8] Metabolic acidosis [J19.1] AKI (acute kidney injury) ([place]) [N17.9] Sepsis with acute renal failure without septic shock, due to unspecified organism, unspecified acute renal failure type ([place]) [A41.9, R65.20, N17.9] Patient Active Problem List   Diagnosis Date Noted  . Metabolic acidosis 47/82/9562  . Palliative care encounter   . AKI (acute kidney injury) ([place]) 09/28/2019  . TIA (transient  ischemic attack) 06/02/2015  . Temporary cerebral vascular dysfunction 06/02/2015  . Transient cerebral ischemia 04/28/2015  . Orthostatic hypotension 05/06/2013  . Hypertension 07/23/2012  . Chest pain 07/28/2011  . SVT (supraventricular tachycardia) ([place]) 07/28/2011  . Hyperlipidemia 07/28/2011   PCP:  System, Pcp Not In Pharmacy:   [place], [address] [place] [address] Phone: [phone] Fax: [place], [address] [address] Phone: [phone] Fax: [phone]     Social Determinants of  Health (SDOH) Interventions    Readmission Risk Interventions No flowsheet data found.

## 2019-09-30 NOTE — Progress Notes (Addendum)
Engineer, maintenance [facility]) Hospital Liaison note.   Received request from [place] for family interest in [facility] with request for transfer today. Chart reviewed and eligibility confirmed. Spoke with  family to confirm interest and explain services. Family agreeable to transfer today. TOC manager aware.  Registration paper work completed. Dr. Orpah Melter to assume care per family request.   RN please call report to [phone]. Please arrange transport for patient.    Thank you,   Beretta Ginsberg, RN, [facility]   [facility]   [place] are on AMION

## 2019-09-30 NOTE — Progress Notes (Signed)
PROGRESS NOTE    Laura Swanson  WNU:[id number] DOB: 30-Aug-1924 DOA: 09/28/2019 PCP: System, Pcp Not In   Brief Narrative:  HPI: Laura Swanson is a 86 y.o. female with medical history significant of dementia, gastric ulcer, SVT, hypertension, hyperlipidemia and type 2 diabetes mellitus who was sent to the [facility] emergency department from her assisted living facility due to failure to thrive.  According to chart, daughter had informed the ED physician that patient had a started declining 10 days ago when she stopped eating and drinking.  No vomiting diarrhea or cough.  She was also diagnosed with UTI and yeast infection a week ago and was treated with ciprofloxacin and Diflucan.  ED Course: Upon arrival to ED, she was in acute distress and ill-appearing and dry appearing as well as tachycardic and tachypneic.  CBC showed leukocytosis of 17,000 with a stable hemoglobin however BMP showed acute renal failure and had significantly elevated lactic acid level.  She was also found to have significant acidosis with lactic acidosis and metabolic acidosis combination.  Due to her age and severe sickness, ED physician had a long discussion with the family and finally family decided to pursue comfort care and subsequently patient was sent to [facility] under hospitalist service for admission.  PS: According to chart review, patient arrived at [place] yesterday around 7 PM however after talking to flow manager, it was revealed that flow managers were not contacted by floor staff for admission and thus no H&P done.  I saw patient today around 10:30 a.m.  Her 2 daughters were at the bedside.  Patient was comfortable with no complaints.  Per daughters, patient has remained comfortable.  In fact this morning her vitals were completely stable.  Daughters were wondering if patient could be at hospice house in [place].  Assessment & Plan:   Active Problems:   AKI (acute kidney injury) ([place])  Metabolic acidosis   Palliative care encounter   Acute renal failure/metabolic acidosis/lactic acidosis/comfort care: Patient completely comfortable.  Vitals are stable.  Talked to daughters at the bedside.  They still want to pursue residential hospice in [place].  TOC assisting family to make that happen.  Appreciate palliative care.  Continue comfort measure.  DVT prophylaxis: SCD Code Status: DNR, present on admission Family Communication: 2 daughters present at the bedside.   Disposition Plan: Will be discharged to residential hospice in [place] once bed available.  Estimated body mass index is 21.58 kg/m as calculated from the following:   Height as of this encounter: 5\' 2"  (1.575 m).   Weight as of this encounter: 53.5 kg.      Nutritional status:               Consultants:   Palliative care  Procedures:   None  Antimicrobials:   None   Subjective: Seen and examined.  Daughter is at the bedside.  Patient remains stable and comfortable.  Objective: Vitals:   09/28/19 1806 09/28/19 1900 09/29/19 0503 09/30/19 0611  BP:  110/83 (!) 117/59 118/63  Pulse:  (!) 107 100 92  Resp: (!) 22 (!) 22 20 15   Temp: 98.8 F (37.1 C)  98.3 F (36.8 C) 97.6 F (36.4 C)  TempSrc: Oral  Oral Oral  SpO2: 99% 100% 100% 98%  Weight:      Height:        Intake/Output Summary (Last 24 hours) at 09/30/2019 1307 Last data filed at 09/30/2019 1100 Gross per 24 hour  Intake  100 ml  Output 450 ml  Net -350 ml   Filed Weights   09/28/19 1250  Weight: 53.5 kg    Examination:  General exam: Appears calm and comfortable  Respiratory system: Clear to auscultation. Respiratory effort normal. Cardiovascular system: S1 & S2 heard, RRR. No JVD, murmurs, rubs, gallops or clicks. No pedal edema. Gastrointestinal system: Abdomen is nondistended, soft and nontender. No organomegaly or masses felt. Normal bowel sounds heard. Central nervous system: Alert and oriented. No  focal neurological deficits. Extremities: Symmetric 5 x 5 power. Skin: No rashes, lesions or ulcers   Data Reviewed: I have personally reviewed following labs and imaging studies  CBC: Recent Labs  Lab 09/28/19 1313 09/28/19 1502  WBC 17.2*  --   NEUTROABS 12.4*  --   HGB 11.7* 11.2*  HCT 36.4 33.0*  MCV 103.7*  --   PLT 306  --    Basic Metabolic Panel: Recent Labs  Lab 09/28/19 1313 09/28/19 1502  NA 132* 132*  K 4.5 5.0  CL 103  --   CO2 6*  --   GLUCOSE 174*  --   BUN 63*  --   CREATININE 3.54*  --   CALCIUM 8.4*  --    GFR: Estimated Creatinine Clearance: 8 mL/min (A) (by C-G formula based on SCr of 3.54 mg/dL (H)). Liver Function Tests: Recent Labs  Lab 09/28/19 1313  AST 36  ALT 17  ALKPHOS 54  BILITOT 0.7  PROT 6.5  ALBUMIN 3.4*   No results for input(s): LIPASE, AMYLASE in the last 168 hours. No results for input(s): AMMONIA in the last 168 hours. Coagulation Profile: Recent Labs  Lab 09/28/19 1313  INR 1.1   Cardiac Enzymes: No results for input(s): CKTOTAL, CKMB, CKMBINDEX, TROPONINI in the last 168 hours. BNP (last 3 results) No results for input(s): PROBNP in the last 8760 hours. HbA1C: No results for input(s): HGBA1C in the last 72 hours. CBG: Recent Labs  Lab 09/28/19 1248  GLUCAP 166*   Lipid Profile: No results for input(s): CHOL, HDL, LDLCALC, TRIG, CHOLHDL, LDLDIRECT in the last 72 hours. Thyroid Function Tests: No results for input(s): TSH, T4TOTAL, FREET4, T3FREE, THYROIDAB in the last 72 hours. Anemia Panel: No results for input(s): VITAMINB12, FOLATE, FERRITIN, TIBC, IRON, RETICCTPCT in the last 72 hours. Sepsis Labs: Recent Labs  Lab 09/28/19 1313  LATICACIDVEN >11.0*  9.5*    Recent Results (from the past 240 hour(s))  Blood Culture (routine x 2)     Status: None (Preliminary result)   Collection Time: 09/28/19  2:58 PM   Specimen: BLOOD  Result Value Ref Range Status   Specimen Description   Final    BLOOD  LEFT ANTECUBITAL Performed at [facility], [address] [place], [place] [address]    Special Requests   Final    BOTTLES DRAWN AEROBIC AND ANAEROBIC Blood Culture adequate volume Performed at [facility], [address] [place], [place] [address]    Culture   Final    NO GROWTH 2 DAYS Performed at [facility], [place] [address], [place], [place] [address]    Report Status PENDING  Incomplete      Radiology Studies: CT Head Wo Contrast  Result Date: 09/28/2019 CLINICAL DATA:  Fall 1 week ago with persistent headache. EXAM: CT HEAD WITHOUT CONTRAST TECHNIQUE: Contiguous axial images were obtained from the base of the skull through the vertex without intravenous contrast. COMPARISON:  09/19/2019 FINDINGS:  Brain: Moderate low density in the periventricular white matter likely related to small vessel disease. Expected cerebral and cerebellar atrophy for age. No mass lesion, hemorrhage, hydrocephalus, acute infarct, intra-axial, or extra-axial fluid collection. Vascular: Intracranial atherosclerosis. Skull: No significant soft tissue swelling.  Normal skull. Sinuses/Orbits: Normal imaged portions of the orbits and globes. Clear paranasal sinuses and mastoid air cells. Other: None. IMPRESSION: 1.  No acute intracranial abnormality. 2.  Cerebral atrophy and small vessel ischemic change. Electronically Signed   By: Abigail Miyamoto M.D.   On: 09/28/2019 14:20   DG Chest Port 1 View  Result Date: 09/28/2019 CLINICAL DATA:  Failure to thrive.  Recent COVID-19 EXAM: PORTABLE CHEST 1 VIEW COMPARISON:  03/25/2015 FINDINGS: Numerous leads and wires project over the chest. Patient rotated left. Normal heart size. Atherosclerosis in the transverse aorta. Mild left hemidiaphragm elevation. No pleural effusion or pneumothorax. Mild right infrahilar subsegmental atelectasis. IMPRESSION: No acute cardiopulmonary disease. Aortic Atherosclerosis (ICD10-I70.0). Electronically Signed   By:  Abigail Miyamoto M.D.   On: 09/28/2019 13:29    Scheduled Meds: Continuous Infusions: . lactated ringers 75 mL/hr at 09/28/19 1533     LOS: 2 days   Time spent: 25 minutes   Darliss Cheney, MD [facility]  09/30/2019, 1:07 PM   To contact the attending provider between 7A-7P or the covering provider during after hours 7P-7A, please log into the web site [email or web address].

## 2019-09-30 NOTE — Progress Notes (Signed)
Patient discharged to [facility] via [facility] transporter, reported to nurse Arbie Cookey, pt's son was at bedside.

## 2019-09-30 NOTE — Discharge Summary (Signed)
Physician Discharge Summary  Laura Swanson MYT:[id number] DOB: 05-Jun-1925 DOA: 09/28/2019  PCP: System, Pcp Not In  Admit date: 09/28/2019 Discharge date: 09/30/2019  Admitted From: Assisted living facility Disposition: [facility]  Recommendations for Outpatient Follow-up:  1. Follow up with PCP in 1-2 weeks 2. Please obtain BMP/CBC in one week 3. Please follow up on the following pending results:  Home Health: None Equipment/Devices: None  Discharge Condition: Fair CODE STATUS: DNR Diet recommendation: Regular  Subjective: Seen and examined.  Daughter is at the bedside.  She has no complaints.  She is comfortable.  Brief/Interim Summary: Laura Swanson is a 86 y.o. female with medical history significant of dementia, gastric ulcer, SVT, hypertension, hyperlipidemia and type 2 diabetes mellitus who was sent to the [facility] emergency department from her assisted living facility due to failure to thrive.  Subsequently she was diagnosed with acute renal failure, metabolic acidosis and lactic acidosis.  Due to her age and poor prognosis, after long discussion between ED physician and the family, family decided to pursue hospice/comfort care.  She was admitted at [facility].  Palliative care and TOC was consulted.  Family decided to pursue hospice home and subsequently a placement was arranged at [facility] and patient is being discharged today.  Discharge Diagnoses:  Active Problems:   AKI (acute kidney injury) [facility])   Metabolic acidosis   Palliative care encounter    Discharge Instructions  Discharge Instructions    Discharge patient   Complete by: As directed    Discharge disposition: 50-Hospice/Home   Discharge patient date: 09/30/2019     Allergies as of 09/30/2019      Reactions   Iodine Hives   Repaglinide Other (See Comments)   Drop blood glucose and became very dizzy with near syncope   Lipitor [atorvastatin] Other (See Comments)   cramps   Rosuvastatin Calcium Other  (See Comments)   Cramps- tolerating taking once weekly Cramps   Statins Other (See Comments)   Cramps   Antiseptic Products, Misc.    Unknown   Disinfectant Products Misc    Unknown   Verapamil Other (See Comments)   constipation      Medication List    TAKE these medications   bacitracin ointment Apply 1 application topically 2 (two) times daily.   CoQ-10 100 MG Caps Take 200 mg by mouth daily.   dipyridamole-aspirin 200-25 MG 12hr capsule Commonly known as: AGGRENOX Take 1 capsule by mouth daily.   fluticasone 50 MCG/ACT nasal spray Commonly known as: FLONASE   metFORMIN 500 MG tablet Commonly known as: GLUCOPHAGE   ONE TOUCH ULTRA TEST test strip Generic drug: glucose blood   polyethylene glycol 17 g packet Commonly known as: MIRALAX / GLYCOLAX Take 17 g by mouth daily.   polyethylene glycol powder 17 GM/SCOOP powder Commonly known as: GLYCOLAX/MIRALAX Take by mouth.   vitamin C 500 MG tablet Commonly known as: ASCORBIC ACID Take 1,000 mg by mouth 2 (two) times daily.   Vitamin D3 50 MCG (2000 UT) capsule Take 2,000 Units by mouth daily.       Allergies  Allergen Reactions  . Iodine Hives  . Repaglinide Other (See Comments)    Drop blood glucose and became very dizzy with near syncope   . Lipitor [Atorvastatin] Other (See Comments)    cramps  . Rosuvastatin Calcium Other (See Comments)    Cramps- tolerating taking once weekly Cramps  . Statins Other (See Comments)    Cramps   . Antiseptic  Products, Misc.     Unknown  . Disinfectant Products Misc     Unknown  . Verapamil Other (See Comments)    constipation    Consultations: Palliative care   Procedures/Studies: CT Head Wo Contrast  Result Date: 09/28/2019 CLINICAL DATA:  Fall 1 week ago with persistent headache. EXAM: CT HEAD WITHOUT CONTRAST TECHNIQUE: Contiguous axial images were obtained from the base of the skull through the vertex without intravenous contrast. COMPARISON:   09/19/2019 FINDINGS: Brain: Moderate low density in the periventricular white matter likely related to small vessel disease. Expected cerebral and cerebellar atrophy for age. No mass lesion, hemorrhage, hydrocephalus, acute infarct, intra-axial, or extra-axial fluid collection. Vascular: Intracranial atherosclerosis. Skull: No significant soft tissue swelling.  Normal skull. Sinuses/Orbits: Normal imaged portions of the orbits and globes. Clear paranasal sinuses and mastoid air cells. Other: None. IMPRESSION: 1.  No acute intracranial abnormality. 2.  Cerebral atrophy and small vessel ischemic change. Electronically Signed   By: Abigail Miyamoto M.D.   On: 09/28/2019 14:20   CT Head Wo Contrast  Result Date: 09/19/2019 CLINICAL DATA:  Unwitnessed fall this evening. Found on bathroom floor. Dementia history EXAM: CT HEAD WITHOUT CONTRAST TECHNIQUE: Contiguous axial images were obtained from the base of the skull through the vertex without intravenous contrast. COMPARISON:  Head CT 06/28/2019 FINDINGS: Brain: No intracranial hemorrhage, mass effect, or midline shift. Generalized atrophy, stable from prior. No hydrocephalus. The basilar cisterns are patent. Moderate chronic small vessel ischemia, unchanged. Senescent bilateral basal gangliar mineralization. No evidence of territorial infarct or acute ischemia. No extra-axial or intracranial fluid collection. Vascular: Atherosclerosis of skullbase vasculature without hyperdense vessel or abnormal calcification. Skull: No fracture or focal lesion. Sinuses/Orbits: Paranasal sinuses and mastoid air cells are clear. The visualized orbits are unremarkable. Bilateral cataract resection. Other: None. IMPRESSION: 1. No acute intracranial abnormality. No skull fracture. 2. Unchanged atrophy and chronic small vessel ischemia. Electronically Signed   By: Keith Rake M.D.   On: 09/19/2019 00:40   CT Cervical Spine Wo Contrast  Result Date: 09/19/2019 CLINICAL DATA:  Post  unwitnessed fall. Dementia patient. EXAM: CT CERVICAL SPINE WITHOUT CONTRAST TECHNIQUE: Multidetector CT imaging of the cervical spine was performed without intravenous contrast. Multiplanar CT image reconstructions were also generated. COMPARISON:  No prior cervical spine CT. CT angiography of the neck 06/28/2019. FINDINGS: Alignment: Straightening of normal lordosis. Trace anterolisthesis of C3 on C4 and C7 on T1, likely degenerative. Skull base and vertebrae: No acute fracture. Vertebral body heights are maintained. The dens and skull base are intact. Diffuse bony under mineralization, bones are heterogeneous in density. Soft tissues and spinal canal: No prevertebral fluid or swelling. No visible canal hematoma. Disc levels: Diffuse degenerative disc disease, most prominent at C5-C6 and C6-C7. Multilevel facet hypertrophy. Bilateral facets at C2-C3 and C3-C4 fused, left C4-C5 facets are fused, findings likely degenerative. Upper chest: No acute findings. Calcified granuloma in the left lung apex. Mild emphysema. Other: 18 mm heterogeneous right thyroid nodule, unchanged from neck CT 3 months ago. Given patient's age, further evaluation recommended only if clinically indicated. IMPRESSION: 1. No acute fracture or subluxation of the cervical spine. 2. Multilevel degenerative disc disease and facet hypertrophy. Electronically Signed   By: Keith Rake M.D.   On: 09/19/2019 00:44   DG Chest Port 1 View  Result Date: 09/28/2019 CLINICAL DATA:  Failure to thrive.  Recent COVID-19 EXAM: PORTABLE CHEST 1 VIEW COMPARISON:  03/25/2015 FINDINGS: Numerous leads and wires project over the chest. Patient rotated  left. Normal heart size. Atherosclerosis in the transverse aorta. Mild left hemidiaphragm elevation. No pleural effusion or pneumothorax. Mild right infrahilar subsegmental atelectasis. IMPRESSION: No acute cardiopulmonary disease. Aortic Atherosclerosis (ICD10-I70.0). Electronically Signed   By: Abigail Miyamoto  M.D.   On: 09/28/2019 13:29      Discharge Exam: Vitals:   09/29/19 0503 09/30/19 0611  BP: (!) 117/59 118/63  Pulse: 100 92  Resp: 20 15  Temp: 98.3 F (36.8 C) 97.6 F (36.4 C)  SpO2: 100% 98%   Vitals:   09/28/19 1806 09/28/19 1900 09/29/19 0503 09/30/19 0611  BP:  110/83 (!) 117/59 118/63  Pulse:  (!) 107 100 92  Resp: (!) 22 (!) 22 20 15   Temp: 98.8 F (37.1 C)  98.3 F (36.8 C) 97.6 F (36.4 C)  TempSrc: Oral  Oral Oral  SpO2: 99% 100% 100% 98%  Weight:      Height:        General: Pt is alert, awake, not in acute distress Cardiovascular: RRR, S1/S2 +, no rubs, no gallops Respiratory: CTA bilaterally, no wheezing, no rhonchi Abdominal: Soft, NT, ND, bowel sounds + Extremities: no edema, no cyanosis    The results of significant diagnostics from this hospitalization (including imaging, microbiology, ancillary and laboratory) are listed below for reference.     Microbiology: Recent Results (from the past 240 hour(s))  Blood Culture (routine x 2)     Status: None (Preliminary result)   Collection Time: 09/28/19  2:58 PM   Specimen: BLOOD  Result Value Ref Range Status   Specimen Description   Final    BLOOD LEFT ANTECUBITAL Performed at [facility], [address] [place], [place] [address]    Special Requests   Final    BOTTLES DRAWN AEROBIC AND ANAEROBIC Blood Culture adequate volume Performed at [facility], [address] [place], [place] [address]    Culture   Final    NO GROWTH 2 DAYS Performed at [facility], [place] [address], [place], [place] [address]    Report Status PENDING  Incomplete     Labs: BNP (last 3 results) No results for input(s): BNP in the last 8760 hours. Basic Metabolic Panel: Recent Labs  Lab 09/28/19 1313 09/28/19 1502  NA 132* 132*  K 4.5 5.0  CL 103  --   CO2 6*  --   GLUCOSE 174*  --   BUN 63*  --   CREATININE 3.54*  --   CALCIUM 8.4*  --    Liver Function Tests: Recent  Labs  Lab 09/28/19 1313  AST 36  ALT 17  ALKPHOS 54  BILITOT 0.7  PROT 6.5  ALBUMIN 3.4*   No results for input(s): LIPASE, AMYLASE in the last 168 hours. No results for input(s): AMMONIA in the last 168 hours. CBC: Recent Labs  Lab 09/28/19 1313 09/28/19 1502  WBC 17.2*  --   NEUTROABS 12.4*  --   HGB 11.7* 11.2*  HCT 36.4 33.0*  MCV 103.7*  --   PLT 306  --    Cardiac Enzymes: No results for input(s): CKTOTAL, CKMB, CKMBINDEX, TROPONINI in the last 168 hours. BNP: Invalid input(s): POCBNP CBG: Recent Labs  Lab 09/28/19 1248  GLUCAP 166*   D-Dimer No results for input(s): DDIMER in the last 72 hours. Hgb A1c No results for input(s): HGBA1C in the last 72 hours. Lipid Profile No results for input(s): CHOL, HDL, LDLCALC, TRIG, CHOLHDL, LDLDIRECT in the  last 72 hours. Thyroid function studies No results for input(s): TSH, T4TOTAL, T3FREE, THYROIDAB in the last 72 hours.  Invalid input(s): FREET3 Anemia work up No results for input(s): VITAMINB12, FOLATE, FERRITIN, TIBC, IRON, RETICCTPCT in the last 72 hours. Urinalysis No results found for: COLORURINE, APPEARANCEUR, [place], [place], GLUCOSEU, [place], [place], [place], [place], UROBILINOGEN, NITRITE, LEUKOCYTESUR Sepsis Labs Invalid input(s): PROCALCITONIN,  WBC,  LACTICIDVEN Microbiology Recent Results (from the past 240 hour(s))  Blood Culture (routine x 2)     Status: None (Preliminary result)   Collection Time: 09/28/19  2:58 PM   Specimen: BLOOD  Result Value Ref Range Status   Specimen Description   Final    BLOOD LEFT ANTECUBITAL Performed at [facility], [address] [place], [place] [address]    Special Requests   Final    BOTTLES DRAWN AEROBIC AND ANAEROBIC Blood Culture adequate volume Performed at [facility], [address] [place], [place] [address]    Culture   Final    NO GROWTH 2 DAYS Performed at [facility], [place] [address] [place],  [place] [address]    Report Status PENDING  Incomplete     Time coordinating discharge: Over 30 minutes  SIGNED:   Darliss Cheney, MD  [facility] 09/30/2019, 1:52 PM  If 7PM-7AM, please contact night-coverage [email or web address] Password [email or web address]

## 2019-09-30 NOTE — Care Management (Signed)
RN will report to [place]. Daughter [name] aware and ready for her mother to be transported to [facility].   Magdalen [place] RN

## 2019-10-03 LAB — CULTURE, BLOOD (ROUTINE X 2)
Culture: NO GROWTH
Special Requests: ADEQUATE

## 2019-11-28 DEATH — deceased

## 2021-09-09 IMAGING — CT CT HEAD W/O CM
3 series · 15 of 47 positions shown, 18 images · non-contrast
Comparison: Head CT 06/28/2019

CLINICAL DATA: Unwitnessed fall this evening. Found on bathroom
floor. Dementia history

EXAM:
CT HEAD WITHOUT CONTRAST
TECHNIQUE: Contiguous axial images were obtained from the base of the skull
through the vertex without intravenous contrast.

[Series 3: head 5.0 h30s · axial · 0.43mm/px · z∈[-132,+8]mm · 9 of 34 slices shown, 12 images]
[im 3/34  brain]
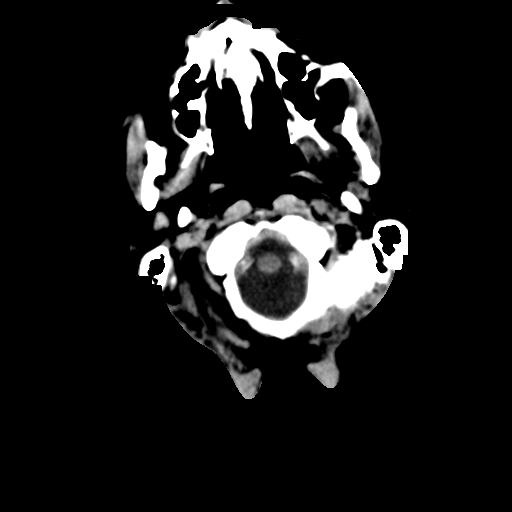
[im 3/34  bone]
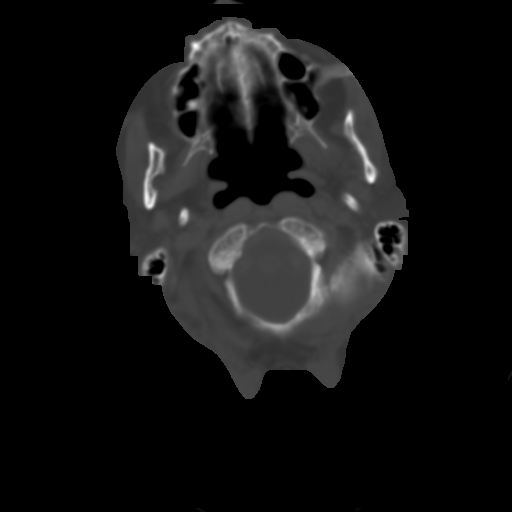
[im 6/34  brain]
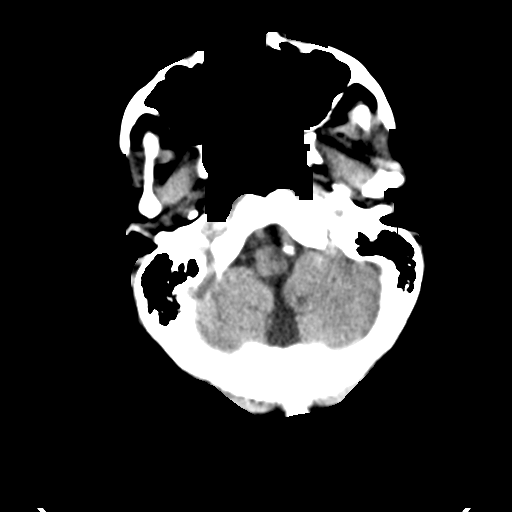
[im 10/34  brain]
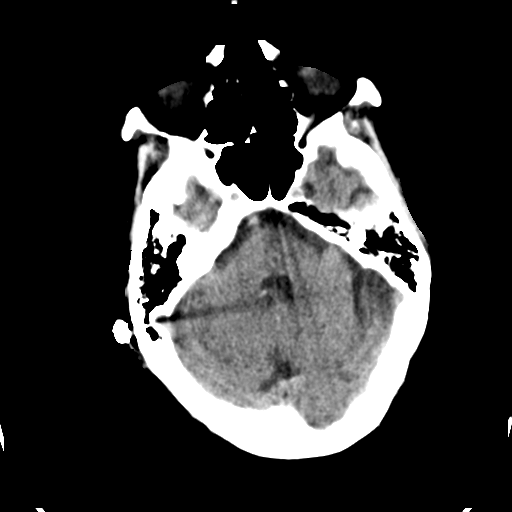
[im 13/34  brain]
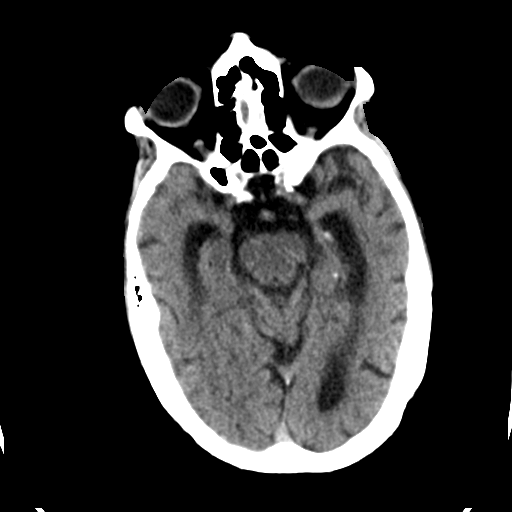
[im 18/34  brain]
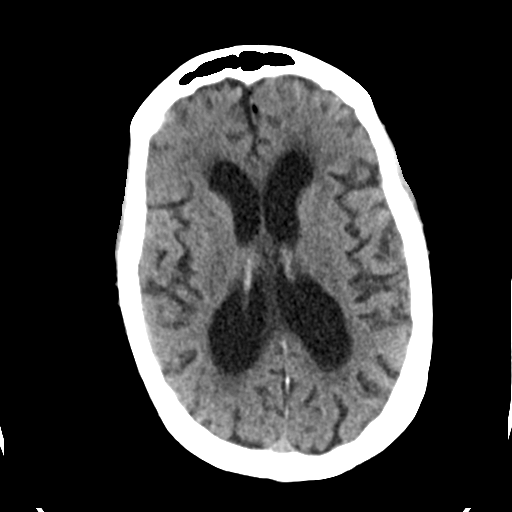
[im 18/34  bone]
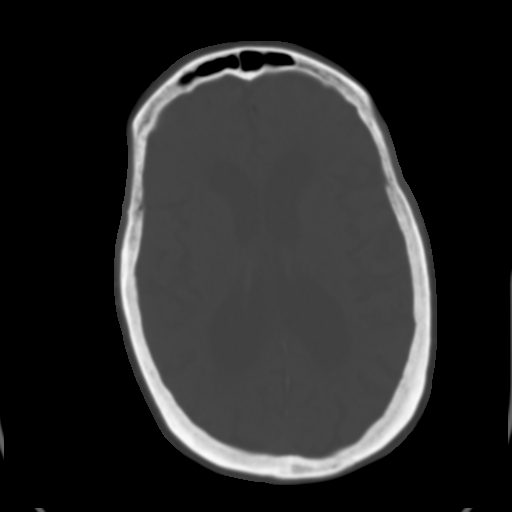
[im 21/34  brain]
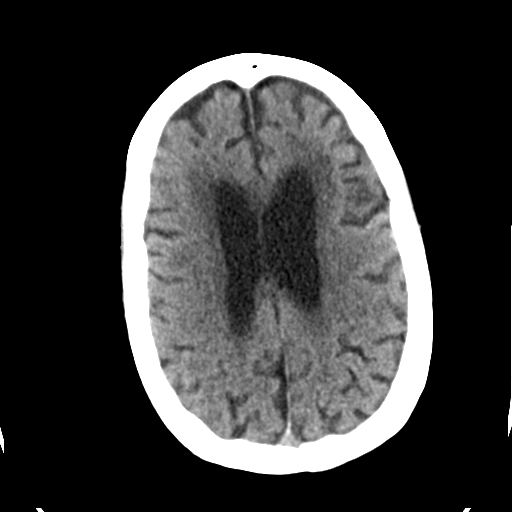
[im 24/34  brain]
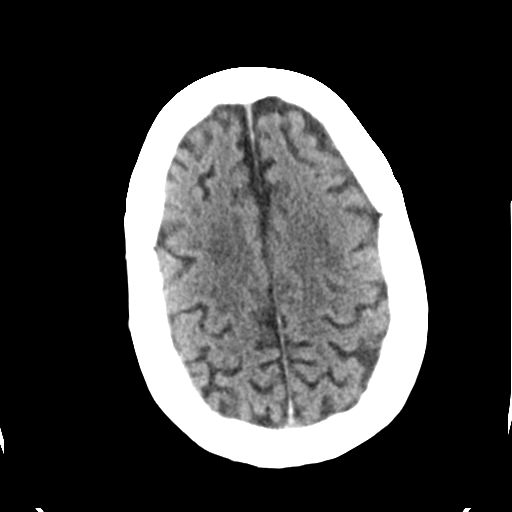
[im 28/34  brain]
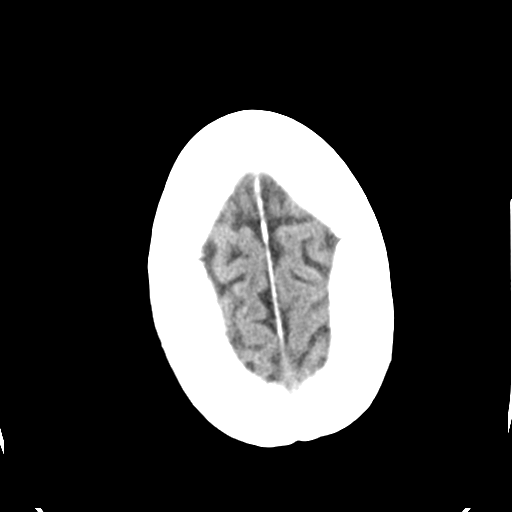
[im 31/34  brain]
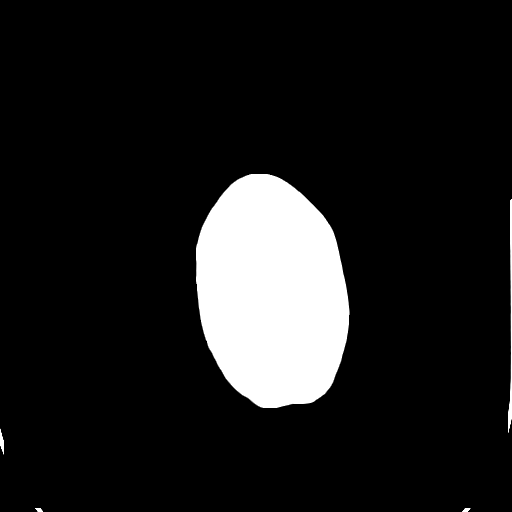
[im 31/34  bone]
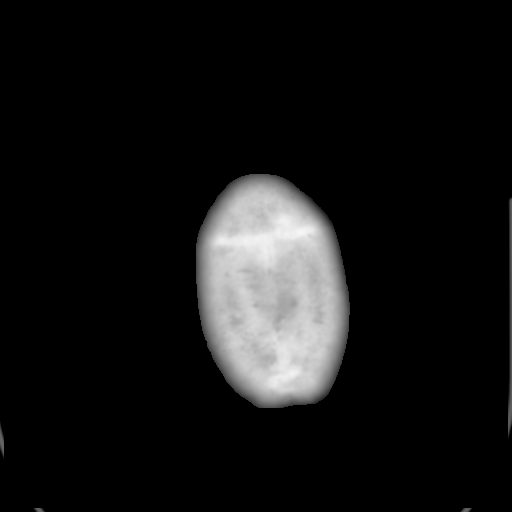

[Series 5: head 3.0 mpr cor · coronal · 0.33mm/px · 3 of 70 slices shown]
[im 24/70  brain]
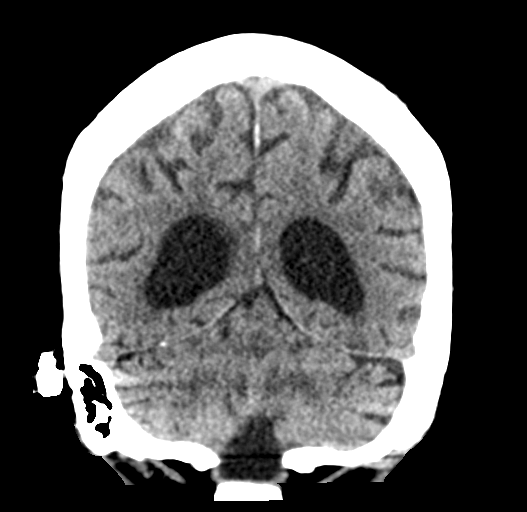
[im 31/70  brain]
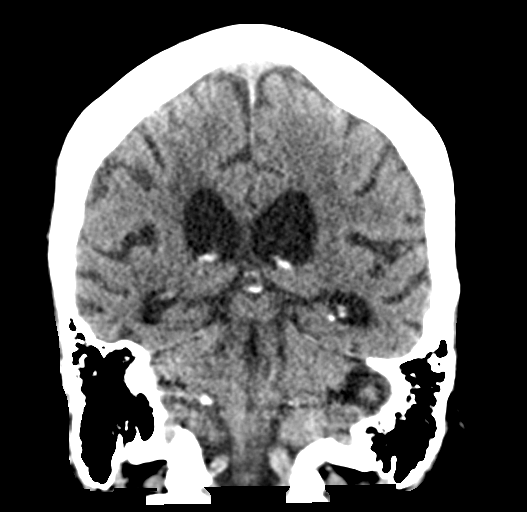
[im 39/70  brain]
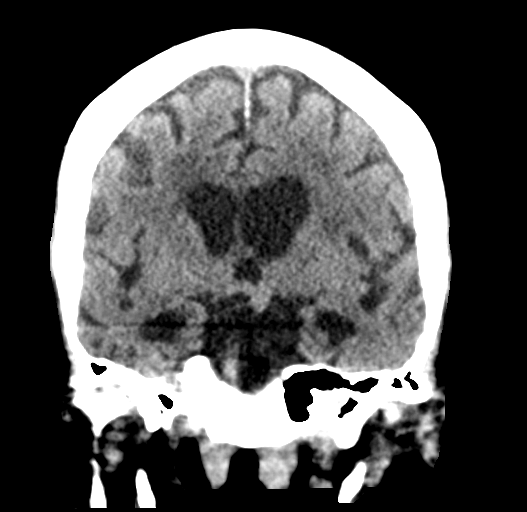

[Series 6: head 3.0 mpr sag · sagittal · 0.33mm/px · 3 of 57 slices shown]
[im 19/57  brain]
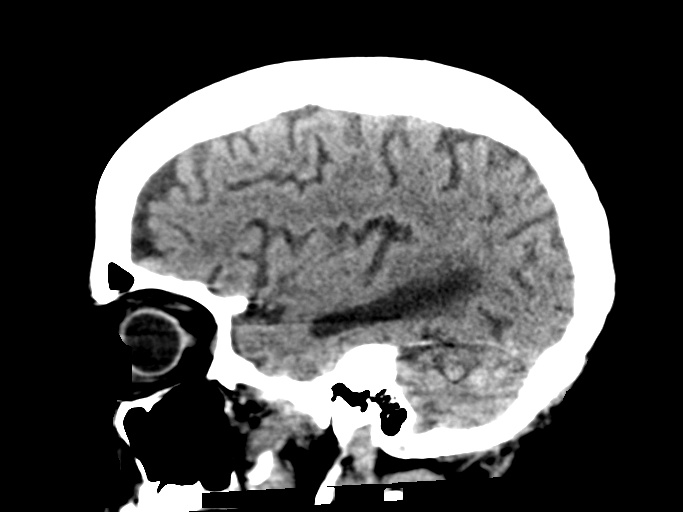
[im 29/57  brain]
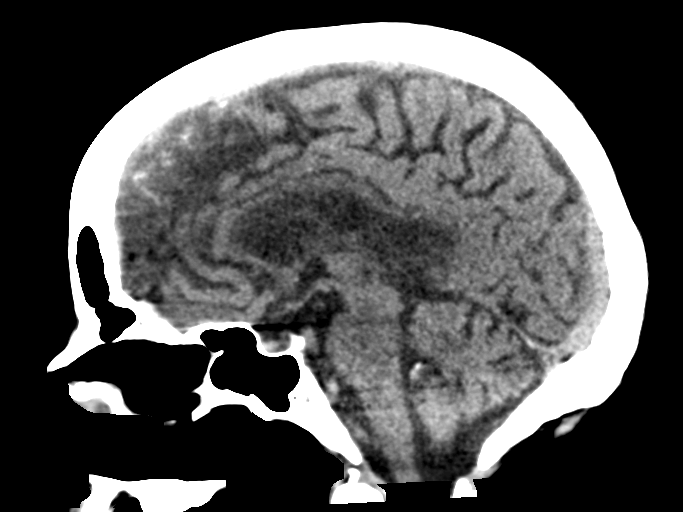
[im 38/57  brain]
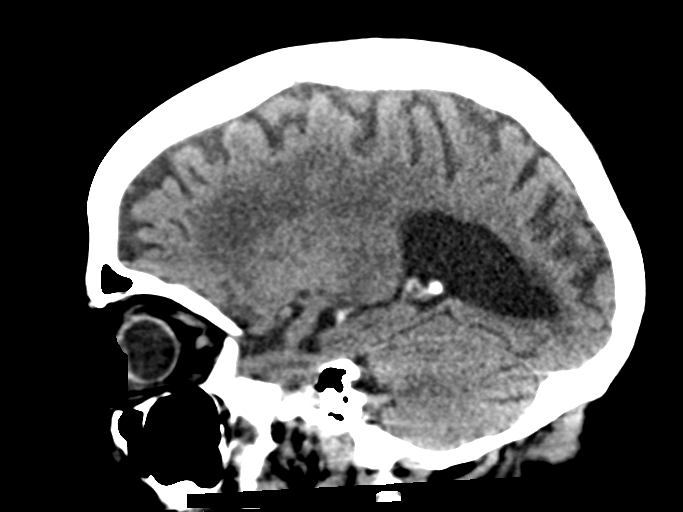

[15 of 47 positions shown; findings below may reference images not displayed]

FINDINGS: Brain: No intracranial hemorrhage, mass effect, or midline shift.
Generalized atrophy, stable from prior. No hydrocephalus. The
basilar cisterns are patent. Moderate chronic small vessel ischemia,
unchanged. Senescent bilateral basal gangliar mineralization. No
evidence of territorial infarct or acute ischemia. No extra-axial or
intracranial fluid collection.

Vascular: Atherosclerosis of skullbase vasculature without
hyperdense vessel or abnormal calcification.

Skull: No fracture or focal lesion.

Sinuses/Orbits: Paranasal sinuses and mastoid air cells are clear.
The visualized orbits are unremarkable. Bilateral cataract
resection.

Other: None.
IMPRESSION: 1. No acute intracranial abnormality. No skull fracture.
2. Unchanged atrophy and chronic small vessel ischemia.

## 2021-09-09 IMAGING — CT CT CERVICAL SPINE W/O CM
3 of 4 series · 10 of 33 positions shown, 12 images · non-contrast
Comparison: No prior cervical spine CT. CT angiography of the neck
06/28/2019.

CLINICAL DATA: Post unwitnessed fall. Dementia patient.

EXAM:
CT CERVICAL SPINE WITHOUT CONTRAST
TECHNIQUE: Multidetector CT imaging of the cervical spine was performed without
intravenous contrast. Multiplanar CT image reconstructions were also
generated.

[Series 5: c_spine 2.0 st · axial · 0.32mm/px · z∈[-248,-186]mm · 2 of 94 slices shown, 3 images]
[im 32/94  soft-tissue]
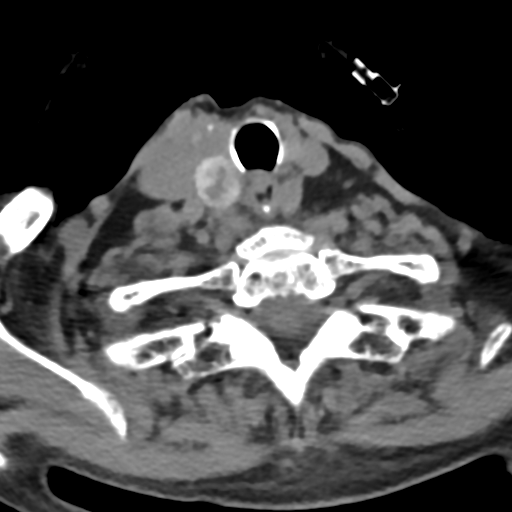
[im 32/94  bone]
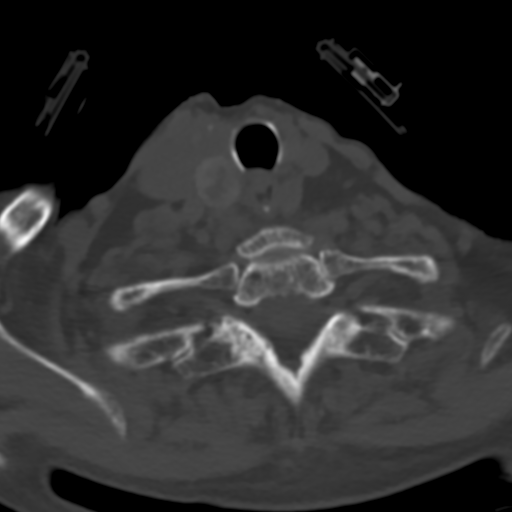
[im 63/94  bone]
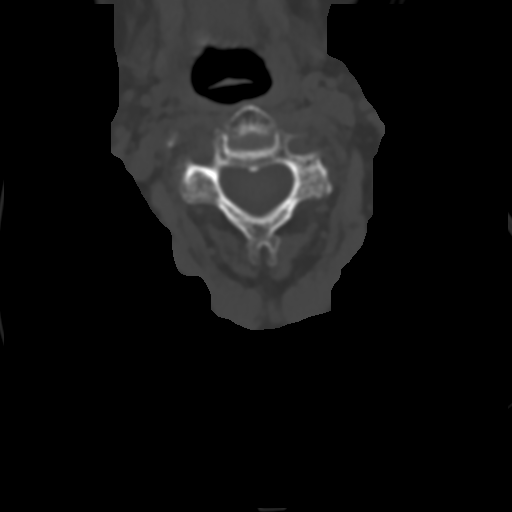

[Series 6: coronal bone · coronal · 0.23mm/px · 3 of 76 slices shown]
[im 17/76  bone]
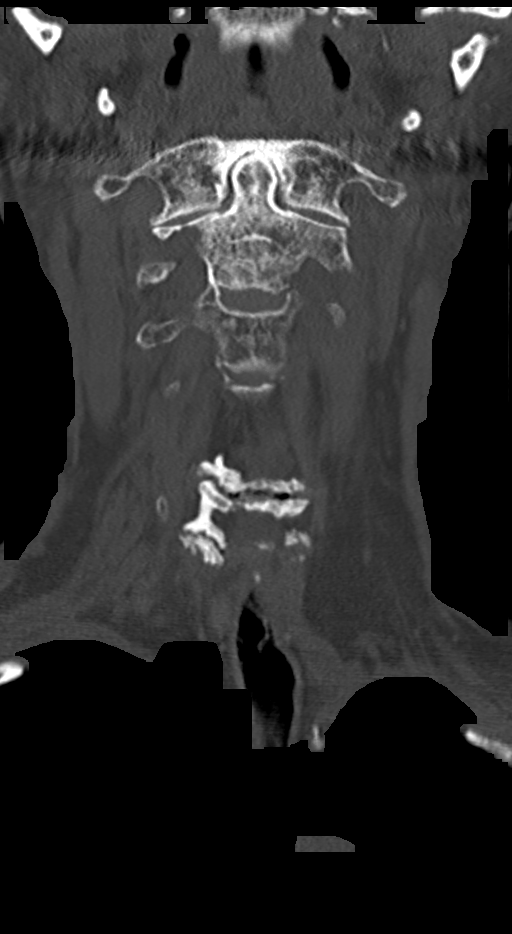
[im 31/76  bone]
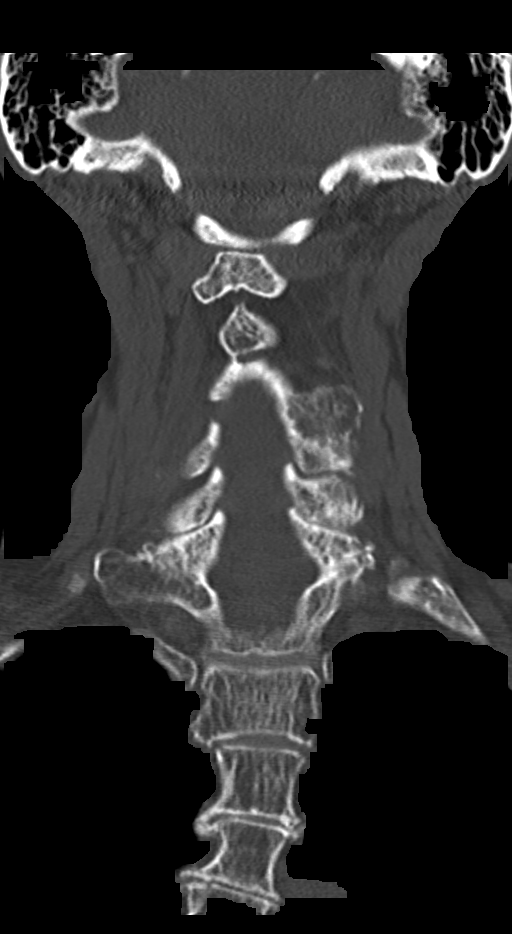
[im 45/76  bone]
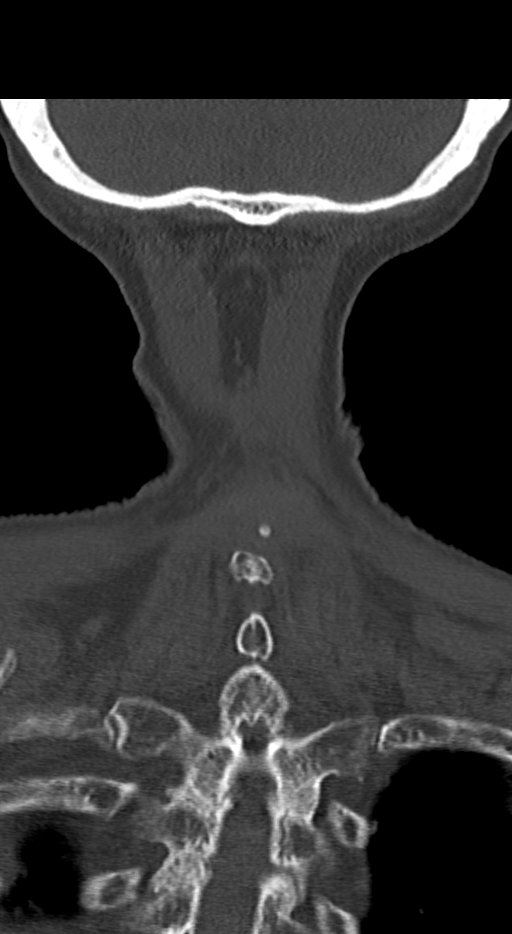

[Series 7: sagittal bone · sagittal · 0.23mm/px · 5 of 61 slices shown, 6 images]
[im 21/61  bone]
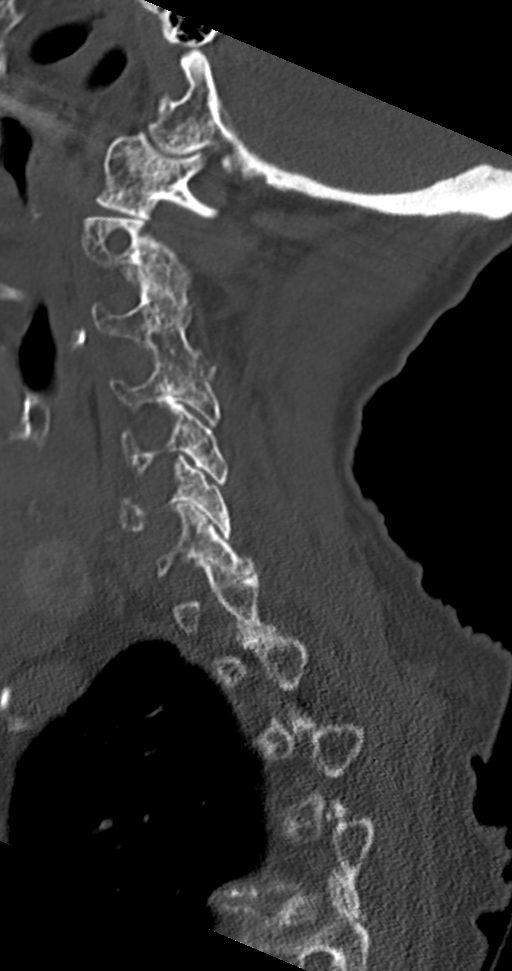
[im 26/61  bone]
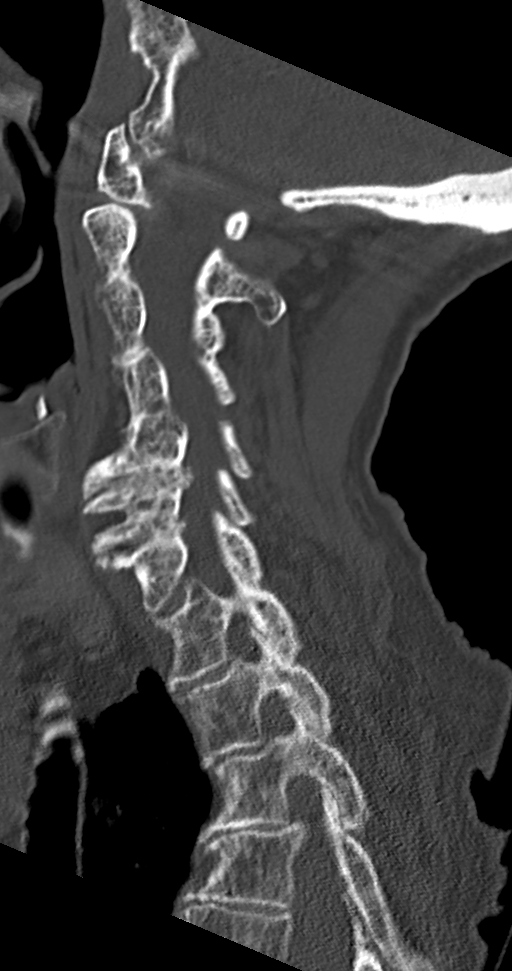
[im 31/61  soft-tissue]
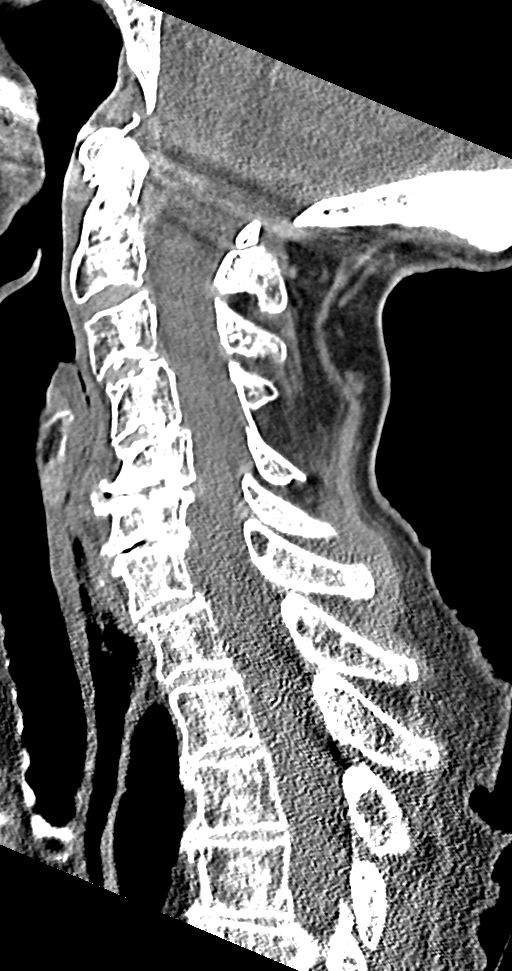
[im 31/61  bone]
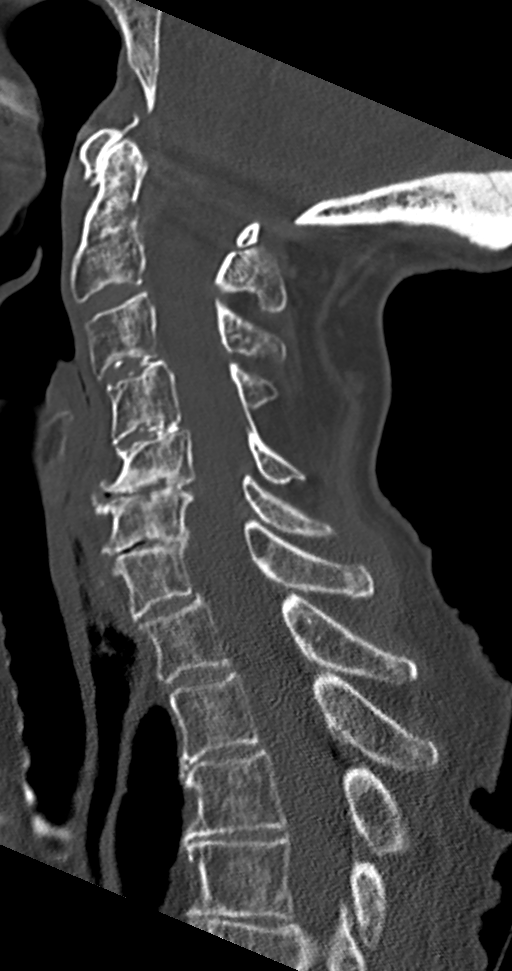
[im 36/61  bone]
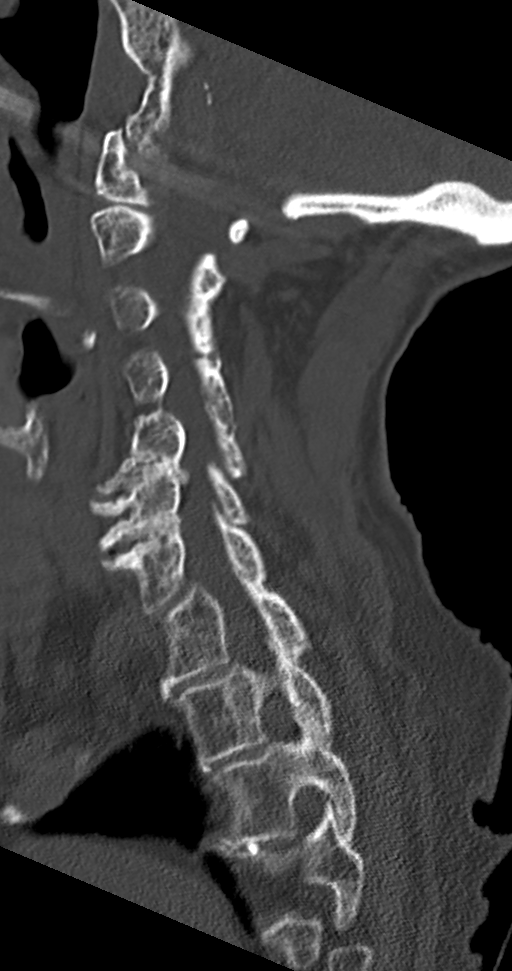
[im 41/61  bone]
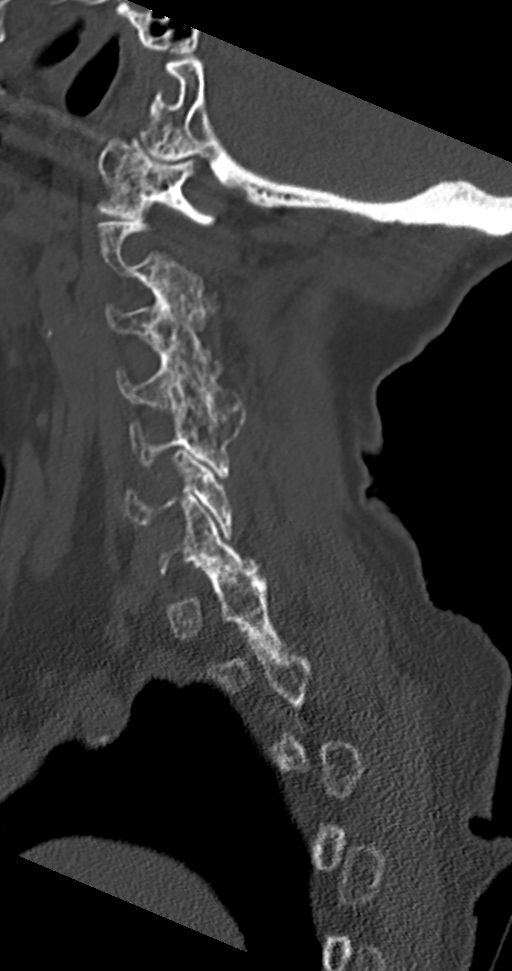

[10 of 33 positions shown; findings below may reference images not displayed]

FINDINGS: Alignment: Straightening of normal lordosis. Trace anterolisthesis
of C3 on C4 and C7 on T1, likely degenerative.

Skull base and vertebrae: No acute fracture. Vertebral body heights
are maintained. The dens and skull base are intact. Diffuse bony
under mineralization, bones are heterogeneous in density.

Soft tissues and spinal canal: No prevertebral fluid or swelling. No
visible canal hematoma.

Disc levels: Diffuse degenerative disc disease, most prominent at
C5-C6 and C6-C7. Multilevel facet hypertrophy. Bilateral facets at
C2-C3 and C3-C4 fused, left C4-C5 facets are fused, findings likely
degenerative.

Upper chest: No acute findings. Calcified granuloma in the left lung
apex. Mild emphysema.

Other: 18 mm heterogeneous right thyroid nodule, unchanged from neck
CT 3 months ago. Given patient's age, further evaluation recommended
only if clinically indicated.
IMPRESSION: 1. No acute fracture or subluxation of the cervical spine.
2. Multilevel degenerative disc disease and facet hypertrophy.
